# Patient Record
Sex: Female | Born: 1940 | Race: White | Hispanic: No | Marital: Married | Smoking: Former smoker
Health system: Southern US, Community
[De-identification: ages and names within clinical notes are randomized; demographics above are authoritative.]

## PROBLEM LIST (undated history)

## (undated) DIAGNOSIS — F0390 Unspecified dementia without behavioral disturbance: Secondary | ICD-10-CM

## (undated) DIAGNOSIS — E785 Hyperlipidemia, unspecified: Secondary | ICD-10-CM

## (undated) DIAGNOSIS — I10 Essential (primary) hypertension: Secondary | ICD-10-CM

## (undated) HISTORY — PX: CHOLECYSTECTOMY: SHX55

## (undated) HISTORY — PX: ABDOMINAL HYSTERECTOMY: SHX81

---

## 1999-04-12 ENCOUNTER — Ambulatory Visit (HOSPITAL_COMMUNITY): Admission: RE | Admit: 1999-04-12 | Discharge: 1999-04-12 | Payer: Self-pay

## 1999-06-14 ENCOUNTER — Other Ambulatory Visit: Admission: RE | Admit: 1999-06-14 | Discharge: 1999-06-14 | Payer: Self-pay | Admitting: Family Medicine

## 1999-12-08 ENCOUNTER — Ambulatory Visit (HOSPITAL_COMMUNITY): Admission: RE | Admit: 1999-12-08 | Discharge: 1999-12-08 | Payer: Self-pay | Admitting: Internal Medicine

## 1999-12-15 ENCOUNTER — Encounter: Payer: Self-pay | Admitting: Internal Medicine

## 1999-12-15 ENCOUNTER — Ambulatory Visit (HOSPITAL_COMMUNITY): Admission: RE | Admit: 1999-12-15 | Discharge: 1999-12-15 | Payer: Self-pay | Admitting: Internal Medicine

## 2000-04-04 ENCOUNTER — Emergency Department (HOSPITAL_COMMUNITY): Admission: EM | Admit: 2000-04-04 | Discharge: 2000-04-04 | Payer: Self-pay | Admitting: Emergency Medicine

## 2001-11-08 ENCOUNTER — Other Ambulatory Visit: Admission: RE | Admit: 2001-11-08 | Discharge: 2001-11-08 | Payer: Self-pay | Admitting: Family Medicine

## 2003-05-01 ENCOUNTER — Other Ambulatory Visit: Admission: RE | Admit: 2003-05-01 | Discharge: 2003-05-01 | Payer: Self-pay | Admitting: Family Medicine

## 2004-05-10 ENCOUNTER — Other Ambulatory Visit: Admission: RE | Admit: 2004-05-10 | Discharge: 2004-05-10 | Payer: Self-pay | Admitting: Family Medicine

## 2004-05-21 ENCOUNTER — Other Ambulatory Visit: Admission: RE | Admit: 2004-05-21 | Discharge: 2004-05-21 | Payer: Self-pay | Admitting: Diagnostic Radiology

## 2006-03-06 ENCOUNTER — Encounter: Admission: RE | Admit: 2006-03-06 | Discharge: 2006-03-06 | Payer: Self-pay | Admitting: Family Medicine

## 2007-01-02 ENCOUNTER — Ambulatory Visit: Payer: Self-pay | Admitting: Cardiology

## 2007-01-19 ENCOUNTER — Encounter: Payer: Self-pay | Admitting: Cardiology

## 2007-01-19 ENCOUNTER — Ambulatory Visit: Payer: Self-pay

## 2007-01-19 ENCOUNTER — Ambulatory Visit: Payer: Self-pay | Admitting: Cardiology

## 2007-01-19 LAB — CONVERTED CEMR LAB
ALT: 14 units/L (ref 0–40)
AST: 21 units/L (ref 0–37)
Albumin: 3.8 g/dL (ref 3.5–5.2)
Alkaline Phosphatase: 84 units/L (ref 39–117)
Bilirubin, Direct: 0.1 mg/dL (ref 0.0–0.3)
Cholesterol: 285 mg/dL (ref 0–200)
Direct LDL: 195.1 mg/dL
HDL: 61.6 mg/dL (ref >39.0)
Total Bilirubin: 0.8 mg/dL (ref 0.3–1.2)
Total CHOL/HDL Ratio: 4.6
Total Protein: 7 g/dL (ref 6.0–8.3)
Triglycerides: 116 mg/dL (ref 0–149)
VLDL: 23 mg/dL (ref 0–40)

## 2007-01-29 ENCOUNTER — Ambulatory Visit: Payer: Self-pay | Admitting: Cardiology

## 2007-02-21 ENCOUNTER — Ambulatory Visit (HOSPITAL_COMMUNITY): Admission: RE | Admit: 2007-02-21 | Discharge: 2007-02-22 | Payer: Self-pay | Admitting: Specialist

## 2007-03-12 ENCOUNTER — Encounter: Admission: RE | Admit: 2007-03-12 | Discharge: 2007-05-31 | Payer: Self-pay | Admitting: Specialist

## 2007-08-29 ENCOUNTER — Ambulatory Visit: Payer: Self-pay | Admitting: Internal Medicine

## 2007-09-21 ENCOUNTER — Encounter: Payer: Self-pay | Admitting: Internal Medicine

## 2007-09-21 ENCOUNTER — Ambulatory Visit: Payer: Self-pay | Admitting: Internal Medicine

## 2007-12-02 IMAGING — CR DG CHEST 2V
2 series · 2 of 2 positions shown · non-contrast
Comparison: None

Exam: Chest, 2 views.

HISTORY: Rotator cuff repair

[w chest pa]
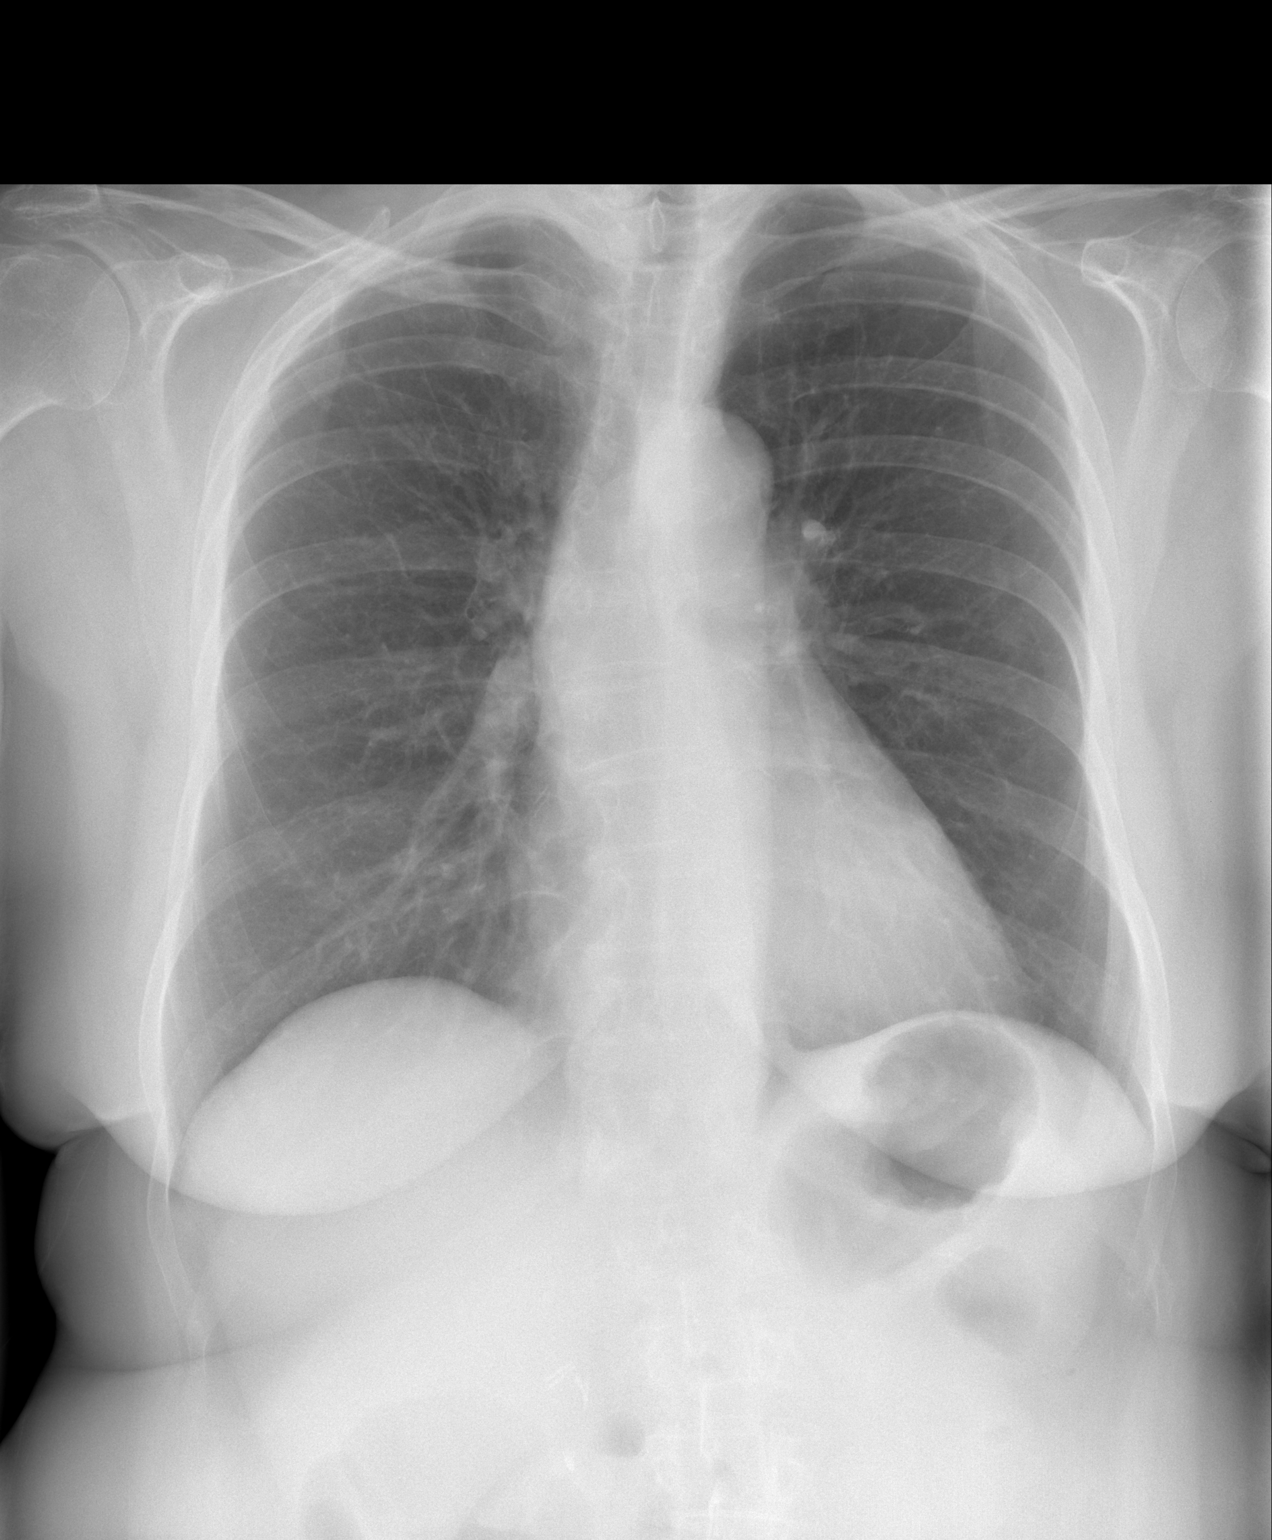

[w chest lat]
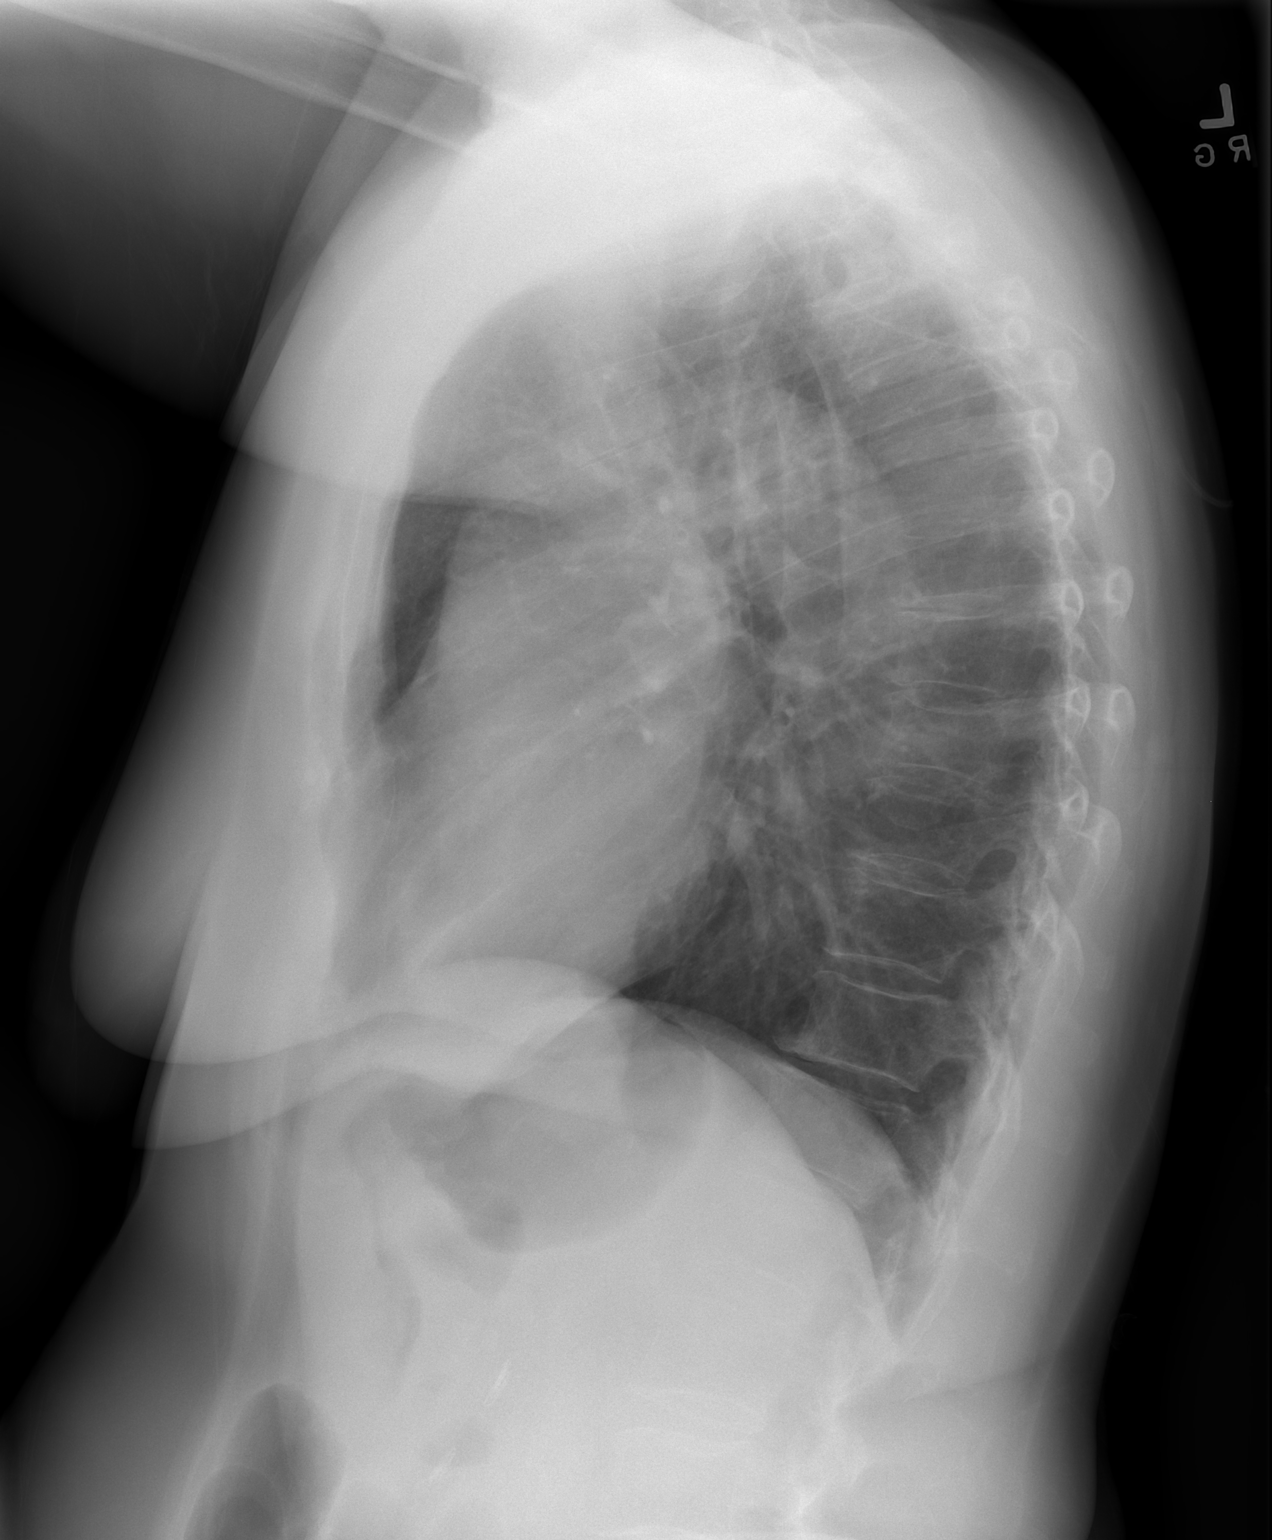

[2 of 2 positions shown; findings below may reference images not displayed]

FINDINGS: Heart size is mildly enlarged. 

There is no pleural effusions or pulmonary edema.

No air space opacities are identified.
IMPRESSION: 1. No active disease.

## 2011-01-04 NOTE — Assessment & Plan Note (Signed)
[facility]                            CARDIOLOGY OFFICE NOTE   BANEZA, BARTOSZEK                       MRN:          [id number]  DATE:01/02/2007                            DOB:          17-Sep-1940    REFERRING PHYSICIAN:  Ernestina Penna, M.D.   REASON FOR CONSULTATION:  Abnormal electrocardiogram.   HISTORY OF PRESENT ILLNESS:  Tonya Caldwell is a pleasant 70 year old woman  previously seen in our office by Jesse Sans. Wall, MD, FACC back in 1998.  She was referred at that time for a diagnostic cardiac catheterization  performed by Bruce R. Juanda Chance, MD, [facility] which revealed mild  nonobstructive atherosclerosis including a 30% left anterior descending  stenosis.  Left ventricular ejection fraction was 60% and there was  questionable slight hypokinesis of the apex.  She reports not having any  follow-up ischemic testing since that time.   She reportedly was helping her husband mow the yard and was turning a  steering wheel vigorously with her left arm several times.  This took  place approximately 10 days ago.  Subsequent to this she complained of  having severe pain in her left arm and shoulder, significantly worse  with movement, in fact with limited motion of the shoulder joint.  She  was reportedly seen by an orthopedist and also had an injection in the  shoulder and some oral pain medications.  Subsequent to this, her  symptoms have resolved.  With her shoulder discomfort she had some pain  in her back as well as discomfort in her left upper chest.   She had an electrocardiogram obtained as part of her evaluation, the  copy of which I have now, and this shows sinus rhythm with left anterior  fascicular block and right bundle branch block pattern with decreased  anterior R wave progression.  Nonspecific ST changes are also noted.  We  have an old tracing from 63 which shows a similar left anterior  fascicular block pattern, but the right bundle  branch block pattern is  new.  Typically she denies having any clear exertional chest pain.  She  has intermittent discomfort in her chest and reports significant  problems with reflux.  She also has occasional nausea and has had some  back pain.  She is not entirely clear about precipitants for these  symptoms, but does not indicate that they are exertional with activities  such as walking up a flight of stairs.   ALLERGIES:  She reports some type of drug allergy, but cannot recall the  medication.   CURRENT MEDICATIONS:  1. Synthroid 88 mcg p.o. daily.  2. Benicar 40 mg p.o. daily.  3. Multivitamin one p.o. daily.  4. Calcium supplements.   PAST MEDICAL HISTORY:  As outlined above.  She has a history of  hypertension, thyroid disease, previous partial hysterectomy,  cholecystectomy, previous documented mild coronary atherosclerosis.   SOCIAL HISTORY:  The patient is married.  She has six children.  She is  a housewife.  She has a prior history of tobacco use, but quit 20 years.  Denies  any recreational drug use or alcohol use.  She does try to walk  some for activity.   FAMILY HISTORY:  Significant for cardiovascular disease.  She has four  brothers all with history of heart attack.  Her mother died at age 14  with Alzheimer's disease and her father died at age 59 with  tuberculosis.   REVIEW OF SYSTEMS:  As described in history of present illness.  She has  constipation at times.  She feels fatigued and washed out.  Reports  problems with hiatal hernia and significant reflux disease.   PHYSICAL EXAMINATION:  VITAL SIGNS:  Blood pressure is 110/80, heart  rate 62, weight is 176 pounds.  The patient is comfortable and in no  acute distress.  HEENT:  Conjunctivae normal, oropharynx clear.  NECK:  Supple, no elevated jugular venous pressure, no bruits, no  thyromegaly.  No thyroid tenderness is obvious.  LUNGS:  Clear without labored breathing.  HEART:  Regular rate and  rhythm.  No loud murmur or S3 gallop.  No  pericardial rub.  ABDOMEN:  Soft, nontender, and no bruits.  Bowel sounds are present.  EXTREMITIES:  Exhibit no pitting edema.  SKIN:  Warm and dry.  MUSCULOSKELETAL:  No kyphosis noted.  NEUROPSYCHIATRIC:  The patient is alert and oriented x3.  Affect is  normal.   IMPRESSION:  1. Abnormal resting electrocardiogram, relatively nonspecific, with      previously documented history of mild coronary atherosclerosis,      hypertension, and family history of cardiovascular disease.  Recent      symptoms seem more musculoskeletal, particularly the left shoulder      discomfort.  She has had intermittent chest pain, although, not      specifically exertional.  This is complicated by a history of      reflux and hiatal hernia.  She has not had follow-up ischemic      evaluation and we discussed proceeding with a two-dimensional      echocardiogram as well as Adenosine Myoview for further assessment.      I will then have her come back to the office to review the      situation further.  2. Further plans to follow.     Jonelle Sidle, MD  Electronically Signed    SGM/MedQ  DD: 01/02/2007  DT: 01/02/2007  Job #: [id number]   cc:   Ernestina Penna, M.D.

## 2011-01-04 NOTE — Assessment & Plan Note (Signed)
[facility]                            CARDIOLOGY OFFICE NOTE   Tonya Caldwell, Tonya Caldwell                       MRN:          [id number]  DATE:01/19/2007                            DOB:          [birth year]    Tonya Caldwell came to the stress lab today for an adenosine Myoview. She  saw Dr. Simona Huh on Jan 02, 2007.   She has a history of nonobstructive coronary artery disease by cath  showing a 30% LAD stenosis in 1998. She actually saw me at the time. She  had normal left ventricular function.   She recently was out mowing with a steering wheel type lawnmower. After  wrenching it around some bushes for several times, she developed a  soreness in her left shoulder and neck.   She was set up for a stress Myoview with her history of coronary artery  disease and her abnormal EKG.   Today while she was in the lab, she developed a band-like tightness  under both ribs in her mid lower sternum. It lasted for about 2-3  minutes. The note says it went to her jaw and she became short of  breath. It is now resolved.   Her preliminary scan shows an EF of 70% with no significant ischemia at  least by what I am seeing. There may be some apical thinning or  attenuation.   She now feels fine.   Her medications are outlined in the chart. She is on Benicar 40. I find  it peculiar she is not on a statin. She said I told her to start one  years ago. Her cholesterol usually runs 250 plus.   PHYSICAL EXAMINATION:  VITAL SIGNS:  Her blood pressure was 157/76, her  pulse was 59 and regular.  GENERAL:  She is in no acute distress.  SKIN:  Warm and dry.  HEENT:  Normocephalic, atraumatic. PERRLA. Extraocular movements intact.  Sclera clear. Facial symmetry is normal. Dentition satisfactory.  NECK:  Supple. Carotid upstrokes equal bilaterally without bruits. There  is no JVD. Thyroid is not enlarged, trachea is midline.  LUNGS:  Clear.  HEART:  Reveals a nondisplaced  PMI. She has normal S1 and S2 without  murmurs, rubs or gallops.  ABDOMEN:  Soft with good bowel sounds.  EXTREMITIES:  Reveal no cyanosis, clubbing or edema. Pulses are intact.  NEUROLOGIC:  Intact.  SKIN:  Intact.   ASSESSMENT:  I suspect that the discomfort that Tonya Caldwell had was  secondary to the adenosine. I have reassured her and her husband. We  have had a long chat about risk factor modification. Since she is  fasting, we will draw lipids and LFTs. I feel strongly she will need a  statin. Will get back with her results. Will also followup with her  final reading of her stress nuclear study.     Thomas C. Daleen Squibb, MD, [facility]  Electronically Signed    TCW/MedQ  DD: 01/19/2007  DT: 01/19/2007  Job #: [id number]   cc:   Jonelle Sidle, MD  [facility]

## 2011-01-04 NOTE — Assessment & Plan Note (Signed)
Tonya Caldwell                         GASTROENTEROLOGY OFFICE NOTE   Tonya Caldwell, Tonya Caldwell                       MRN:          [id number]  DATE:08/29/2007                            DOB:          [birth year]    Patient is self-referred.   REASON FOR EVALUATION:  Diarrhea and requesting colonoscopy.   HISTORY:  This is a 70 year old white female with a history of  hypertension, hyperlipidemia and hypothyroidism.  She refers herself for  evaluation of diarrhea.  She is accompanied by her husband.  A followup  colonoscopy requested.  Patient was seen in this office on one occasion  in April of 2001, at that time to evaluate constipation and abdominal  pain.  She also had complaints of reflux with intermittent dysphagia.  Colonoscopy and upper endoscopy were performed December 08, 1999.  Colonoscopy including intubation of the terminal ileum was normal.  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  She was subsequently set up for  esophageal dilation which was performed December 15, 1999.  She was dilated  to a maximal diameter of 18 mm.  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  She was placed on Nexium 40 mg daily.  She was asked to  follow up in our office in 3 months but failed to do so.  She has not  been seen since.  Her and her husband tell me that she was on Nexium for  a couple of years.  She seemed to do well though at some point developed  loose stools which was attributed to Nexium.  She is currently on no  medications for reflux.  Despite this, she denies heartburn or recurrent  dysphagia.  She now reports having loose stools once per day,  approximately 3 or 4 times per week.  There is no associated urgency,  cramping or bleeding.  This has gone on for the past 2-3 months.  There  is no nocturnal component or weight loss.  Appetite is good.  No change  in diet.  Her only new  medication is Zocor.   PAST MEDICAL HISTORY:  As above.   PAST SURGICAL HISTORY:  1. Cholecystectomy.  2. Hysterectomy.  3. Appendectomy.   ALLERGIES:  BACTRIM.   CURRENT MEDICATIONS:  1. Synthroid 0.88 mg daily.  2. Benicar 40 mg daily.  3. Multivitamin.  4. Calcium.  5. Aspirin 81 mg.  6. Zocor 10 mg daily.   FAMILY HISTORY:  Son with a history of colon polyps.  Father and brother  with heart disease.   SOCIAL HISTORY:  Patient is married with 5 children.  She is accompanied  by her husband.  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   REVIEW OF SYSTEMS:  Per diagnostic evaluation form.   PHYSICAL EXAMINATION:  Well-appearing female, no acute distress.  Blood  pressure 130/80, heart rate 76, weight is 178.6 pounds, she is 5 feet 2  inches in height.  HEENT:  Sclerae are anicteric, conjunctivae are pink, oral mucosa is  intact.  There  is no adenopathy.  LUNGS:  Clear.  HEART:  Regular.  ABDOMEN:  Soft without tenderness, mass or hernia.  Good bowel sounds  heard.  EXTREMITIES:  Without edema.   IMPRESSION:  1. Intermittent loose stools, nonspecific without alarm features.  I      doubt this is pathologic.  2. Family history of colon polyps.  Last colonoscopy 8 years ago.      Reasonable to pursue repeat evaluation for the purposes of repeat      screening as well as evaluate change in bowel habits.  3. Reflux disease with a history of esophageal stricture and a web.      Currently asymptomatic, off of proton pump inhibitor post dilation.   RECOMMENDATIONS:  1. Colonoscopy with polypectomy if necessary.  The nature of the      procedure as well as the risks, benefits and alternatives have been      reviewed.  She understood and agreed to proceed.  2. Align one daily for 2 weeks to see if this improves bowel habits.      Samples have been provided.  3. Warned regarding recurrence of reflux symptoms and/or dysphagia off      proton pump inhibitors.  She  may need to resume over the counter      Prilosec OTC for recurrent symptoms.  4. Ongoing general medical care with [facility].     Wilhemina Bonito. Marina Goodell, MD  Electronically Signed    JNP/MedQ  DD: 08/29/2007  DT: 08/29/2007  Job #: [id number]   cc:   Paulita Cradle, NP

## 2011-01-04 NOTE — Assessment & Plan Note (Signed)
[facility]                            CARDIOLOGY OFFICE NOTE   SAACHI, ZALE                       MRN:          [id number]  DATE:01/29/2007                            DOB:          1941-07-28    PRIMARY CARE PHYSICIAN:  Dr. Rudi Heap.   REASON FOR VISIT:  Followup cardiac testing.   HISTORY OF PRESENT ILLNESS:  I saw Ms. Steeves back in May.  Her history  is detailed in my previous note.  I referred her for an echocardiogram  as well as an adenosine Myoview.  She actually saw Dr. Daleen Squibb back on the  thirtieth of May given some chest pain that she experienced during her  stress test.  This study was quite reassuring, revealing an ejection  fraction of 70% with normal wall motion and no evidence of scar or  ischemia.  She has had complete resolution of her previous symptoms.  Her echocardiogram report is noted, describing an area of bright  echodensity near the anterolateral apex with question of fibrosis of the  endocardium, cannot exclude mass.  The ejection fraction was calculated  at 60% - 70%.  I reviewed these images and there is a small echo-bright  area in this region that is non-mobile.  It is possible that there is an  area of calcification of either the endomyocardium or chordal  apparatus/trabeculation.  She has had no TIA or stroke symptoms.  No  large wall motion abnormalities noted to explain this from the  perspective of a thrombus.  I reviewed these results with the patient  and her husband today.  Followup blood work obtained by Dr. Daleen Squibb at the  last visit demonstrated a total cholesterol of 285, LDL 195, HDL 61, and  triglycerides 116.  I also reviewed these as well.   PRESENT MEDICATIONS:  1. Synthroid 88 mcg p.o. daily.  2. Benicar 40 mg p.o. daily.  3. Multivitamin daily.  4. Calcium daily.  5  Aspirin 81 mg Monday, Wednesday, Friday, Saturday.   REVIEW OF SYSTEMS:  As described in the history of present  illness.   EXAMINATION:  Blood pressure is 158/90, heart rate is 60, weight is 180  pounds.  The patient is comfortable and in no acute distress.  HEENT:  Conjunctivae and lids normal, oropharynx is clear.  NECK:  Supple, no elevated jugular venous pressure or loud bruits, no  thyromegaly is noted.  LUNGS:  Clear without labored breathing.  CARDIAC:  Reveals a regular rate and rhythm, loud murmur or S3 gallop.  ABDOMEN:  Soft, nontender and no bruits.  EXTREMITIES:  Show no pitting edema.  SKIN:  Warm and dry.  MUSCULOSKELETAL:  No kyphosis is noted.  NEUROPSYCHIATRIC:  Patient alert and oriented x3, affect is normal.   IMPRESSION AND RECOMMENDATIONS:  1. Significant hyperlipidemia with a direct LDL cholesterol of 195.  I      discussed statin therapy with the patient and her husband today.      She states that she was prescribed Lipitor in the past and had to  discontinue it after only a few days due to significant muscle      weakness.  She would like to review therapy with her primary care      physician although in general statin therapy would be indicated in      her case for aggressive risk factor modification.  I would      certainly avoid Lipitor and consider other preparations such as      either Zocor or perhaps Crestor as a next option.  Ideally given      her prior documented history of mild coronary atherosclerosis, I      would aim for an LDL cholesterol around 70.  She certainly has a      long way to go to achieve this goal.  2. Reassuring Myoview indicating no evidence of scar or ischemia.  I      suspect that the patient's chest pain was musculoskeletal as      discussed previously.  Abnormal echocardiogram with bright      echodensity in the anterolateral apex.  This may well be an area of      fibrosis or calcification as discussed.  Could not entirely exclude      thrombus or mass.  I reviewed this with the patient and her husband      today.  We talked  about proceeding with additional studies such as      a cardiac MRI to better define this, although at this point she was      most comfortable with simple observation alone.  We have      compromised and I will have her follow up with me over the next 6      months with a repeat echocardiogram to ensure stability.  We can      proceed on from there.     Jonelle Sidle, MD  Electronically Signed    SGM/MedQ  DD: 01/29/2007  DT: 01/29/2007  Job #: [id number]   cc:   Ernestina Penna, M.D.

## 2011-01-04 NOTE — Op Note (Signed)
NAMEWOODROW, DULSKI                ACCOUNT NO.:  [id number]   MEDICAL RECORD NO.:  [id number]          PATIENT TYPE:  AMB   LOCATION:  DAY                          FACILITY:  [facility]   PHYSICIAN:  Jene Every, M.D.    DATE OF BIRTH:  25-Jul-1941   DATE OF PROCEDURE:  02/21/2007  DATE OF DISCHARGE:                               OPERATIVE REPORT   PREOPERATIVE DIAGNOSES:  1. Rotator cuff tear.  2. Adhesive capsulitis.  3. Impingement syndrome, left shoulder.   POSTOPERATIVE DIAGNOSES:  1. Rotator cuff tear.  2. Adhesive capsulitis.  3. Impingement syndrome, left shoulder.   PROCEDURE:  1. MUA, EUA.  2. Open left rotator cuff repair.  3. Subacromial decompression.  4. Lysis of adhesions.   ANESTHESIA:  General.   ASSISTANT:  Strader.   BRIEF HISTORY:  A 70 year old with refractory shoulder pain, impingement  syndrome,  full-thickness tear and an MRI refractory to conservative  treatment, limited range of motion indicated for repair and subacromial  decompression.  The risks and benefits were discussed including  bleeding, infection, injury to vascular structures, suboptimal range of  motion, recurrent tear, need for revision, etc.   The patient preoperatively was noted to have dysuria, no fevers or  chills and a presumed UTI on UA. She was given Cipro 400 mg IV.  No  evidence of systemic infection, therefore proceeded with the scheduled  case.   TECHNIQUE:  The patient in the supine beach-chair position after  induction of adequate anesthesia and 400 mg IV Cipro, the left shoulder  and upper extremity was prepped and draped in the usual sterile fashion.  An  incision was made over the anterior aspect of the acromion.  The  subcutaneous tissue was dissected, electrocautery was utilized to  achieve hemostasis.  The raphe between the anterior and lateral heads of  the deltoid was identified, divided approximately 3 cm from the  anterolateral aspect of the acromion and  subperiosteally elevated. From  the anterolateral aspect of the acromion, the CA ligament was excised.  The anterior acromial spur was removed with an oscillating saw, further  contoured with a 3-mm Kerrison.  Hypertrophic bursa was excised, full-  thickness tear was noted longitudinally in the supraspinatus with no  detachment from its origin. It was debrided,  excised to good bleeding  tissue and repaired side-to-side with #1 Vicryl figure-of-eight suture.  I lysed prior to that adhesion the  subacromial with the digital lysis.  Prior to the incision, we ranged the shoulder with no significant  restriction noted.   No impingement underneath the [facility] joint was noted.  The wound was  copiously irrigated.  Inspection revealed excellent repair and full  coverage, no tension on the repair site.  I then repaired the raphe with  #1 Vicryl interrupted figure-of-eight sutures.  The subcutaneous tissue  was reapproximated with 2-0 Vicryl simple sutures. The skin was  reapproximated with 4-0 subcuticular Prolene.  The wound was reinforced  with Steri-Strips, sterile dressing applied and placed supine on the  hospital bed. After a sling was applied, extubated without difficulty  and  transported to the recovery room in satisfactory condition. The  patient tolerated the procedure well with no complications.      Jene Every, M.D.  Electronically Signed     JB/MEDQ  D:  02/21/2007  T:  02/21/2007  Job:  [id number]

## 2011-06-07 LAB — URINE CULTURE
Colony Count: NO GROWTH
Culture: NO GROWTH
Special Requests: POSITIVE

## 2011-06-08 LAB — HEMOGLOBIN AND HEMATOCRIT, BLOOD: HCT: 38

## 2011-06-08 LAB — BASIC METABOLIC PANEL
CO2: 29
Calcium: 9.8
Chloride: 107
Creatinine, Ser: 1.04
GFR calc Af Amer: >60
Glucose, Bld: 99
Sodium: 142

## 2011-06-08 LAB — APTT: aPTT: 33

## 2011-06-08 LAB — PROTIME-INR: Prothrombin Time: 13.1

## 2012-05-03 ENCOUNTER — Encounter (HOSPITAL_BASED_OUTPATIENT_CLINIC_OR_DEPARTMENT_OTHER): Payer: Medicare Other | Attending: Internal Medicine

## 2012-05-03 DIAGNOSIS — E039 Hypothyroidism, unspecified: Secondary | ICD-10-CM | POA: Insufficient documentation

## 2012-05-03 DIAGNOSIS — S91009A Unspecified open wound, unspecified ankle, initial encounter: Secondary | ICD-10-CM | POA: Insufficient documentation

## 2012-05-03 DIAGNOSIS — I10 Essential (primary) hypertension: Secondary | ICD-10-CM | POA: Insufficient documentation

## 2012-05-03 DIAGNOSIS — L03119 Cellulitis of unspecified part of limb: Secondary | ICD-10-CM | POA: Insufficient documentation

## 2012-05-03 DIAGNOSIS — K219 Gastro-esophageal reflux disease without esophagitis: Secondary | ICD-10-CM | POA: Insufficient documentation

## 2012-05-03 DIAGNOSIS — F0390 Unspecified dementia without behavioral disturbance: Secondary | ICD-10-CM | POA: Insufficient documentation

## 2012-05-03 DIAGNOSIS — Z79899 Other long term (current) drug therapy: Secondary | ICD-10-CM | POA: Insufficient documentation

## 2012-05-03 DIAGNOSIS — Y849 Medical procedure, unspecified as the cause of abnormal reaction of the patient, or of later complication, without mention of misadventure at the time of the procedure: Secondary | ICD-10-CM | POA: Insufficient documentation

## 2012-05-03 DIAGNOSIS — L02419 Cutaneous abscess of limb, unspecified: Secondary | ICD-10-CM | POA: Insufficient documentation

## 2012-05-03 DIAGNOSIS — S81009A Unspecified open wound, unspecified knee, initial encounter: Secondary | ICD-10-CM | POA: Insufficient documentation

## 2012-05-03 NOTE — Progress Notes (Signed)
[facility]  NAME:  Tonya Caldwell, PHELAN NO.:  [id number]  MEDICAL RECORD NO.:  [id number]      DATE OF BIRTH:  1941/06/09  PHYSICIAN:  Maxwell Caul, M.D. VISIT DATE:  05/03/2012                                  OFFICE VISIT   LOCATION:  [facility].  [name] is a 71 year old woman who arrives accompanied by her husband who provides all the information.  She currently had an area on her left lower leg that was concerning and she was referred to a dermatologist in [place].  She had this area biopsied by a punch biopsy roughly 8 months ago.  However the area never really healed and eventually she ended up with a chronic wound about the size of a dime.  This has become more and more painful.  The patient has dementia, has a lot of difficulty keeping her hands away from this area.  I do not believe that it is being cultured as far as I am aware.  PAST MEDICAL HISTORY:  Hypothyroidism, gastroesophageal reflux disease, hypertension, dementia.  She has a history of cholecystectomy and hysterectomy.  MEDICATION LIST:  Reviewed.  She is on Synthroid, not sure of the milligram amount, omeprazole 20 daily, Lasix 40 daily, ASA 160 daily, Exelon patch 9.6 daily and she is also on another blood pressure medication.  PHYSICAL EXAMINATION:  VITAL SIGNS:  Temperature is 98.5, pulse 69, respirations 14, blood pressure is 138/78.  RESPIRATORY:  Clear air entry bilaterally.  CARDIAC:  Heart sounds are normal.  She does not appear to be dehydrated. EXTREMITIES:  Peripheral pulses are palpable.  WOUND EXAM:  On the medial left lower leg is the area measuring 0.9 x 1.3 x 0.1.  The area had a fibrous base to it which I attempted debridement, however she tolerated this poorly.  This did not seem to have probe more deeply.  The area was cultured.  More worrisome than surrounding the wound is an area of warmth and erythema which suggest  a fairly large area of surrounding cellulitis.  There was no crepitus here however the entire erythematous area was painful.  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  I have cultured this.  I think she has surrounding cellulitis.  I have started her on Septra.  I avoided doxycycline due to a significant gastroesophageal reflux history.  A culture of this wound was done.  The Septra is empiric.  To the actual wound we used Santyl with a foam covering under a Kerlix, Coban wrap.  I have provided her analgesics with hydrocodone APAP 5/325 one p.o. q.6 p.r.n.  we will see her again in a week.          ______________________________ Maxwell Caul, M.D.     MGR/MEDQ  D:  05/03/2012  T:  05/03/2012  Job:  [id number]

## 2012-05-31 ENCOUNTER — Encounter (HOSPITAL_BASED_OUTPATIENT_CLINIC_OR_DEPARTMENT_OTHER): Payer: Medicare Other | Attending: Internal Medicine

## 2012-06-28 ENCOUNTER — Encounter (HOSPITAL_BASED_OUTPATIENT_CLINIC_OR_DEPARTMENT_OTHER): Payer: Medicare Other | Attending: Internal Medicine

## 2012-06-28 DIAGNOSIS — L98499 Non-pressure chronic ulcer of skin of other sites with unspecified severity: Secondary | ICD-10-CM | POA: Insufficient documentation

## 2012-06-28 DIAGNOSIS — I872 Venous insufficiency (chronic) (peripheral): Secondary | ICD-10-CM | POA: Insufficient documentation

## 2012-07-26 ENCOUNTER — Encounter (HOSPITAL_BASED_OUTPATIENT_CLINIC_OR_DEPARTMENT_OTHER): Payer: Medicare Other

## 2012-07-26 ENCOUNTER — Encounter (HOSPITAL_BASED_OUTPATIENT_CLINIC_OR_DEPARTMENT_OTHER): Payer: Medicare Other | Attending: Internal Medicine

## 2012-07-26 DIAGNOSIS — Y849 Medical procedure, unspecified as the cause of abnormal reaction of the patient, or of later complication, without mention of misadventure at the time of the procedure: Secondary | ICD-10-CM | POA: Insufficient documentation

## 2012-07-26 DIAGNOSIS — S81809A Unspecified open wound, unspecified lower leg, initial encounter: Secondary | ICD-10-CM | POA: Insufficient documentation

## 2012-08-30 ENCOUNTER — Encounter (HOSPITAL_BASED_OUTPATIENT_CLINIC_OR_DEPARTMENT_OTHER): Payer: Medicare Other | Attending: Internal Medicine

## 2012-08-30 DIAGNOSIS — Y849 Medical procedure, unspecified as the cause of abnormal reaction of the patient, or of later complication, without mention of misadventure at the time of the procedure: Secondary | ICD-10-CM | POA: Insufficient documentation

## 2012-08-30 DIAGNOSIS — T8189XA Other complications of procedures, not elsewhere classified, initial encounter: Secondary | ICD-10-CM | POA: Insufficient documentation

## 2012-09-18 ENCOUNTER — Encounter: Payer: Self-pay | Admitting: Internal Medicine

## 2012-09-27 ENCOUNTER — Encounter (HOSPITAL_BASED_OUTPATIENT_CLINIC_OR_DEPARTMENT_OTHER): Payer: Medicare Other | Attending: Internal Medicine

## 2012-09-27 DIAGNOSIS — Y849 Medical procedure, unspecified as the cause of abnormal reaction of the patient, or of later complication, without mention of misadventure at the time of the procedure: Secondary | ICD-10-CM | POA: Insufficient documentation

## 2012-09-27 DIAGNOSIS — T8189XA Other complications of procedures, not elsewhere classified, initial encounter: Secondary | ICD-10-CM | POA: Insufficient documentation

## 2012-10-04 ENCOUNTER — Encounter (HOSPITAL_BASED_OUTPATIENT_CLINIC_OR_DEPARTMENT_OTHER): Payer: Medicare Other

## 2012-11-23 ENCOUNTER — Ambulatory Visit: Payer: Self-pay | Admitting: Family Medicine

## 2013-01-11 ENCOUNTER — Other Ambulatory Visit: Payer: Self-pay | Admitting: Family Medicine

## 2013-02-04 ENCOUNTER — Telehealth: Payer: Self-pay | Admitting: Family Medicine

## 2013-02-04 NOTE — Telephone Encounter (Signed)
Need to evaluate and check tests. Before meds

## 2013-02-05 NOTE — Telephone Encounter (Signed)
Pt aware she will need appt

## 2013-02-15 ENCOUNTER — Telehealth: Payer: Self-pay | Admitting: Family Medicine

## 2013-02-15 NOTE — Telephone Encounter (Signed)
Husband is having a very difficult time taking care of Hildy on his own. He is requesting home health or hospice services.  Dajane is unable to come in for a visit.  Husband is emotionally and physically exhausted.   I will speak with Dr. Christell Constant tomorrow and contact Mr. Borbon.

## 2013-02-16 NOTE — Telephone Encounter (Signed)
Spoke with patient's husband again this morning.  A Hospice nurse is going to come out and evaluate her on Mon 02/18/13 to see what services she can receive. He will let us know if we need to fill out any intake forms.

## 2013-02-18 ENCOUNTER — Telehealth: Payer: Self-pay | Admitting: Family Medicine

## 2013-02-18 ENCOUNTER — Telehealth: Payer: Self-pay

## 2013-02-18 NOTE — Telephone Encounter (Signed)
Letter faxed to Jaci Carrel as requested with weights .

## 2013-02-18 NOTE — Telephone Encounter (Signed)
Tonya Caldwell would like documentation of weight loss \\per  her chart weight readings show significant weight loss And they would like to admit her to hospice for malnutrition    Call back [phone]

## 2013-02-18 NOTE — Telephone Encounter (Signed)
This is okay for her to do and call hospice

## 2013-02-19 NOTE — Telephone Encounter (Signed)
Letter faxed to Tonya Caldwell for pt 's malnutrition and weight loss.

## 2013-02-20 ENCOUNTER — Telehealth: Payer: Self-pay | Admitting: *Deleted

## 2013-02-20 NOTE — Telephone Encounter (Signed)
Family refused psych consult. They would like a standing order for Haldol 1mg  q 4 hours prn, may repeat in 1 hour x 1. Increase to 5mg  in not effective

## 2013-02-20 NOTE — Telephone Encounter (Signed)
This is okay to do this

## 2013-02-20 NOTE — Telephone Encounter (Signed)
The standing order is okay

## 2013-02-20 NOTE — Telephone Encounter (Signed)
Hospice went out and did an assessment on her yesterday. They are going to accept her but feels she needs a Mental Health Consult and needs an order for this

## 2013-02-25 ENCOUNTER — Telehealth: Payer: Self-pay | Admitting: *Deleted

## 2013-02-25 MED ORDER — CHLORPROMAZINE HCL 25 MG PO TABS: 25.0000 mg | ORAL_TABLET | Freq: Three times a day (TID) | ORAL | Status: DC

## 2013-02-25 NOTE — Telephone Encounter (Signed)
Hospice nurse called and said that Dr Christell Constant ok'd rx for haldol last week and that pt has received twice. It completely knocks her out and husband does not want her to have anymore. She is still combative with husband if she doesn't have any meds. Per hospice the next step would be to try Thorazine. Will you ok and if so please send to [facility]. Please let hospice nurse know at [phone]

## 2013-02-25 NOTE — Telephone Encounter (Signed)
Patient aware and rx sent to [facility]

## 2013-02-25 NOTE — Telephone Encounter (Signed)
Changed halodol to throazine rx sent to pharmacy

## 2013-02-26 ENCOUNTER — Telehealth: Payer: Self-pay | Admitting: Nurse Practitioner

## 2013-02-26 ENCOUNTER — Telehealth: Payer: Self-pay | Admitting: Family Medicine

## 2013-02-26 NOTE — Telephone Encounter (Signed)
Ok will see

## 2013-02-26 NOTE — Telephone Encounter (Signed)
WANTS TO BRING PT IN TO DISCUSS MEDS. HOSPICE TOLD HER TO CALL us. PT VERY SLEEPY, DOESN'T GET UP. NOT EATING. WANTS APPT.

## 2013-02-27 NOTE — Telephone Encounter (Signed)
appt given for Friday at 9:00 with [place] Charon.

## 2013-03-01 ENCOUNTER — Ambulatory Visit: Payer: Self-pay | Admitting: Family Medicine

## 2013-03-04 ENCOUNTER — Telehealth: Payer: Self-pay | Admitting: *Deleted

## 2013-03-04 NOTE — Telephone Encounter (Signed)
This is very complicated! Tonya Caldwell, who is currently on Hospice, needs a visit because hospice is pulling out because they do not think she is appropriate for them! Family wants to try Namenda, but hospice feels like it is more psychiatric? Anyway they can not get here in here for appt, she had one last week and family couldn/t get her out of house. They want to know if we can do a home visit to assess? Please call Hospice at [phone] and family at [phone]

## 2013-03-04 NOTE — Telephone Encounter (Signed)
Spoke with husband and he refused to take her behavioral health. He is requesting call from hospice. Hospice notified and message left on [facility] to call pt.

## 2013-03-04 NOTE — Telephone Encounter (Signed)
Will need to take her to Behavioral health for assessment if they think it is psychiatric. The psyche team will need to assess. She will need OV. Not doing any more home  Visits.

## 2013-03-11 ENCOUNTER — Telehealth: Payer: Self-pay | Admitting: Family Medicine

## 2013-03-11 NOTE — Telephone Encounter (Signed)
Do not think that Namenda is appropriate in this situation You have to take Namenda for several weeks to see any benefit and he is used for Alzheimer's patient

## 2013-03-11 NOTE — Telephone Encounter (Signed)
Lorazepam called to [facility],  Dwm - son DON wonders how you feel about namenda for her- just let him know

## 2013-03-11 NOTE — Telephone Encounter (Signed)
Hospice coming into home

## 2013-03-11 NOTE — Telephone Encounter (Signed)
Dr Christell Constant, [name] could not tolerate the haldol or the thorazine- she was very lethargic and slept constantly- these were stopped and her med list is up to date.  Husband and son are concerned about her behaviors- she gets very aggitated when people are there and every morning when he has to "clean her" and change her.  Is there any meds they can give her just prn to take the "egde off"- because other than these occasions - she is doing ok (walking , trys to talk, and smiles)  [facility] use [facility] [place] Please call Roe Coombs at [phone]

## 2013-03-11 NOTE — Telephone Encounter (Signed)
Try lorazepam 0.5 one twice daily if needed for agitation #30 no refill

## 2013-03-12 NOTE — Telephone Encounter (Signed)
Son aware we will not try namenda

## 2013-04-12 ENCOUNTER — Other Ambulatory Visit: Payer: Self-pay | Admitting: Family Medicine

## 2013-04-12 NOTE — Telephone Encounter (Signed)
Last seen 08/23/12  FPW

## 2013-04-12 NOTE — Telephone Encounter (Signed)
Needs to be seen by a provider

## 2013-04-15 ENCOUNTER — Encounter: Payer: Self-pay | Admitting: Internal Medicine

## 2013-04-15 ENCOUNTER — Other Ambulatory Visit: Payer: Self-pay | Admitting: Family Medicine

## 2013-04-15 NOTE — Telephone Encounter (Signed)
[facility] notified that furosemided been refused -pt needs appt

## 2013-04-16 NOTE — Telephone Encounter (Signed)
Last seen 08/23/12  FPW 

## 2013-04-17 NOTE — Telephone Encounter (Signed)
Prescription renewed in EPIC. Patient needs to be seen.

## 2013-04-17 NOTE — Telephone Encounter (Addendum)
Spoke with Roe Coombs (son) and he states she is mointored by Hospice by [facility]. Informed son that Hospice should have Physician team to mointor her medications ,weights,and labs.  Son agreed that hospice should take over her meds

## 2013-05-28 ENCOUNTER — Inpatient Hospital Stay (HOSPITAL_COMMUNITY)
Admission: EM | Admit: 2013-05-28 | Discharge: 2013-06-03 | DRG: 480 | Disposition: A | Discharge status: Skilled Nursing Facility | Payer: Medicare Other | Attending: Family Medicine | Admitting: Family Medicine

## 2013-05-28 ENCOUNTER — Emergency Department (HOSPITAL_COMMUNITY): Payer: Medicare Other

## 2013-05-28 ENCOUNTER — Encounter (HOSPITAL_COMMUNITY): Payer: Self-pay | Admitting: Emergency Medicine

## 2013-05-28 DIAGNOSIS — R634 Abnormal weight loss: Secondary | ICD-10-CM | POA: Diagnosis present

## 2013-05-28 DIAGNOSIS — E870 Hyperosmolality and hypernatremia: Secondary | ICD-10-CM | POA: Diagnosis present

## 2013-05-28 DIAGNOSIS — D649 Anemia, unspecified: Secondary | ICD-10-CM | POA: Diagnosis present

## 2013-05-28 DIAGNOSIS — F02818 Dementia in other diseases classified elsewhere, unspecified severity, with other behavioral disturbance: Secondary | ICD-10-CM | POA: Diagnosis present

## 2013-05-28 DIAGNOSIS — F0390 Unspecified dementia without behavioral disturbance: Secondary | ICD-10-CM

## 2013-05-28 DIAGNOSIS — Z66 Do not resuscitate: Secondary | ICD-10-CM | POA: Diagnosis present

## 2013-05-28 DIAGNOSIS — R630 Anorexia: Secondary | ICD-10-CM | POA: Diagnosis present

## 2013-05-28 DIAGNOSIS — S72143A Displaced intertrochanteric fracture of unspecified femur, initial encounter for closed fracture: Principal | ICD-10-CM | POA: Diagnosis present

## 2013-05-28 DIAGNOSIS — E039 Hypothyroidism, unspecified: Secondary | ICD-10-CM | POA: Diagnosis present

## 2013-05-28 DIAGNOSIS — G309 Alzheimer's disease, unspecified: Secondary | ICD-10-CM | POA: Diagnosis present

## 2013-05-28 DIAGNOSIS — E43 Unspecified severe protein-calorie malnutrition: Secondary | ICD-10-CM | POA: Insufficient documentation

## 2013-05-28 DIAGNOSIS — F0280 Dementia in other diseases classified elsewhere without behavioral disturbance: Secondary | ICD-10-CM | POA: Diagnosis present

## 2013-05-28 DIAGNOSIS — S72001A Fracture of unspecified part of neck of right femur, initial encounter for closed fracture: Secondary | ICD-10-CM

## 2013-05-28 DIAGNOSIS — F0281 Dementia in other diseases classified elsewhere with behavioral disturbance: Secondary | ICD-10-CM | POA: Diagnosis present

## 2013-05-28 DIAGNOSIS — W06XXXA Fall from bed, initial encounter: Secondary | ICD-10-CM | POA: Diagnosis present

## 2013-05-28 DIAGNOSIS — S72009A Fracture of unspecified part of neck of unspecified femur, initial encounter for closed fracture: Secondary | ICD-10-CM

## 2013-05-28 DIAGNOSIS — E785 Hyperlipidemia, unspecified: Secondary | ICD-10-CM | POA: Diagnosis present

## 2013-05-28 DIAGNOSIS — E871 Hypo-osmolality and hyponatremia: Secondary | ICD-10-CM | POA: Diagnosis present

## 2013-05-28 DIAGNOSIS — E876 Hypokalemia: Secondary | ICD-10-CM | POA: Diagnosis not present

## 2013-05-28 DIAGNOSIS — IMO0002 Reserved for concepts with insufficient information to code with codable children: Secondary | ICD-10-CM

## 2013-05-28 DIAGNOSIS — Z87891 Personal history of nicotine dependence: Secondary | ICD-10-CM

## 2013-05-28 DIAGNOSIS — I10 Essential (primary) hypertension: Secondary | ICD-10-CM | POA: Diagnosis present

## 2013-05-28 DIAGNOSIS — Z515 Encounter for palliative care: Secondary | ICD-10-CM

## 2013-05-28 DIAGNOSIS — N179 Acute kidney failure, unspecified: Secondary | ICD-10-CM | POA: Diagnosis not present

## 2013-05-28 DIAGNOSIS — Y9210 Unspecified residential institution as the place of occurrence of the external cause: Secondary | ICD-10-CM | POA: Diagnosis present

## 2013-05-28 HISTORY — DX: Hyperlipidemia, unspecified: E78.5

## 2013-05-28 HISTORY — DX: Essential (primary) hypertension: I10

## 2013-05-28 HISTORY — DX: Unspecified dementia, unspecified severity, without behavioral disturbance, psychotic disturbance, mood disturbance, and anxiety: F03.90

## 2013-05-28 LAB — BASIC METABOLIC PANEL
Calcium: 9.4 mg/dL (ref 8.4–10.5)
GFR calc non Af Amer: 43 mL/min — ABNORMAL LOW (ref >90)
Potassium: 3.5 mEq/L (ref 3.5–5.1)
Sodium: 147 mEq/L — ABNORMAL HIGH (ref 135–145)

## 2013-05-28 LAB — CBC WITH DIFFERENTIAL/PLATELET
Hemoglobin: 11.5 g/dL — ABNORMAL LOW (ref 12.0–15.0)
Lymphocytes Relative: 11 % — ABNORMAL LOW (ref 12–46)
Lymphs Abs: 1 10*3/uL (ref 0.7–4.0)
Monocytes Relative: 9 % (ref 3–12)
Neutro Abs: 7.3 10*3/uL (ref 1.7–7.7)
Neutrophils Relative %: 80 % — ABNORMAL HIGH (ref 43–77)
Platelets: 177 10*3/uL (ref 150–400)
RBC: 3.92 MIL/uL (ref 3.87–5.11)
WBC: 9.2 10*3/uL (ref 4.0–10.5)

## 2013-05-28 LAB — URINALYSIS, ROUTINE W REFLEX MICROSCOPIC
Glucose, UA: NEGATIVE mg/dL
Hgb urine dipstick: NEGATIVE
Ketones, ur: NEGATIVE mg/dL
Protein, ur: NEGATIVE mg/dL

## 2013-05-28 LAB — PROTIME-INR
INR: 1 (ref 0.00–1.49)
Prothrombin Time: 13 seconds (ref 11.6–15.2)

## 2013-05-28 LAB — URINE MICROSCOPIC-ADD ON

## 2013-05-28 MED ORDER — LORAZEPAM 2 MG/ML IJ SOLN
0.5000 mg | Freq: Once | INTRAMUSCULAR | Status: AC
  Administered 2013-05-28: 0.5 mg via INTRAVENOUS
  Filled 2013-05-28: qty 1

## 2013-05-28 MED ORDER — ONDANSETRON HCL 4 MG/2ML IJ SOLN
4.0000 mg | Freq: Once | INTRAMUSCULAR | Status: AC
  Administered 2013-05-28: 4 mg via INTRAVENOUS
  Filled 2013-05-28: qty 2

## 2013-05-28 MED ORDER — SODIUM CHLORIDE 0.9 % IV SOLN
1000.0000 mL | INTRAVENOUS | Status: DC
  Administered 2013-05-28: 1000 mL via INTRAVENOUS

## 2013-05-28 MED ORDER — MORPHINE SULFATE 2 MG/ML IJ SOLN
2.0000 mg | Freq: Once | INTRAMUSCULAR | Status: AC
  Administered 2013-05-28: 2 mg via INTRAVENOUS
  Filled 2013-05-28: qty 1

## 2013-05-28 NOTE — ED Notes (Signed)
Patient fell out of bed (about 10ft) about three hours ago. Family got her back up onto bed, but she wouldn't lay down, would not bear weight, moaned in distress to movement of right leg and seemed in pain, so called EMS. Family states severe dementia, on hospice, and is a DNR. On arrival, holding onto right hip which looks deformed. Good pulses to bilateral feet.

## 2013-05-28 NOTE — ED Notes (Signed)
Orthopedic MD at bedside. 

## 2013-05-28 NOTE — ED Provider Notes (Signed)
CSN: [id number]     Arrival date & time 05/28/13  1926 History   First MD Initiated Contact with Patient 05/28/13 1945     Chief Complaint  Patient presents with  . Hip Pain  level 5 caveat due to dementia. (Consider location/radiation/quality/duration/timing/severity/associated sxs/prior Treatment) Patient is a 72 y.o. female presenting with hip pain. The history is provided by the patient and a relative.  Hip Pain   patient reportedly fell out of bed onto a carpeted floor. She has severe dementia she is on hospice for. She initially was ambulatory but then has not been able to walk. Appears to have pain in her right hip. She is not on anticoagulation.  Past Medical History  Diagnosis Date  . Dementia   . Hypertension   . Hyperlipemia    Past Surgical History  Procedure Laterality Date  . Cholecystectomy    . Abdominal hysterectomy    . Hip closed reduction Right 05/29/2013   History reviewed. No pertinent family history. History  Substance Use Topics  . Smoking status: Former Games developer  . Smokeless tobacco: Not on file  . Alcohol Use: No   OB History   Grav Para Term Preterm Abortions TAB SAB Ect Mult Living                 Review of Systems  Unable to perform ROS: Dementia    Allergies  Review of patient's allergies indicates no known allergies.  Home Medications   Current Outpatient Rx  Name  Route  Sig  Dispense  Refill  . acetaminophen (TYLENOL) 500 MG tablet   Oral   Take 1 tablet (500 mg total) by mouth every 6 (six) hours as needed for pain.   30 tablet   0   . aspirin EC 325 MG tablet   Oral   Take 1 tablet (325 mg total) by mouth daily.   30 tablet   0    BP 125/88  Pulse 70  Temp(Src) 98.2 F (36.8 C) (Oral)  Resp 18  Ht 5\' 1"  (1.549 m)  Wt 120 lb (54.432 kg)  BMI 22.69 kg/m2  SpO2 99% Physical Exam  Constitutional: She appears well-developed and well-nourished.  HENT:  Head: Normocephalic.  Cardiovascular: Normal rate and regular  rhythm.   Pulmonary/Chest: Effort normal.  Abdominal: Soft.  Musculoskeletal: She exhibits tenderness.  Tenderness to right hip and pelvis. Decreased range of motion. No tenderness at knee or ankle. Some shortening of right lower extremity.  Neurological: She is alert.  Severe baseline dementia    ED Course  Procedures (including critical care time) Labs Review Labs Reviewed  BASIC METABOLIC PANEL - Abnormal; Notable for the following:    Sodium 147 (*)    Glucose, Bld 160 (*)    Creatinine, Ser 1.21 (*)    GFR calc non Af Amer 43 (*)    GFR calc Af Amer 50 (*)    All other components within normal limits  CBC WITH DIFFERENTIAL - Abnormal; Notable for the following:    Hemoglobin 11.5 (*)    HCT 34.1 (*)    Neutrophils Relative % 80 (*)    Lymphocytes Relative 11 (*)    All other components within normal limits  URINALYSIS, ROUTINE W REFLEX MICROSCOPIC - Abnormal; Notable for the following:    APPearance CLOUDY (*)    Leukocytes, UA SMALL (*)    All other components within normal limits  URINE MICROSCOPIC-ADD ON - Abnormal; Notable for the following:  Squamous Epithelial / LPF MANY (*)    All other components within normal limits  BASIC METABOLIC PANEL - Abnormal; Notable for the following:    Potassium 3.4 (*)    Glucose, Bld 116 (*)    GFR calc non Af Amer 49 (*)    GFR calc Af Amer 57 (*)    All other components within normal limits  CBC - Abnormal; Notable for the following:    RBC 3.18 (*)    Hemoglobin 9.3 (*)    HCT 27.7 (*)    All other components within normal limits  CBC - Abnormal; Notable for the following:    RBC 3.08 (*)    Hemoglobin 9.1 (*)    HCT 27.1 (*)    Platelets 120 (*)    All other components within normal limits  CREATININE, SERUM - Abnormal; Notable for the following:    GFR calc non Af Amer 62 (*)    GFR calc Af Amer 72 (*)    All other components within normal limits  CBC - Abnormal; Notable for the following:    RBC 2.91 (*)     Hemoglobin 8.6 (*)    HCT 25.0 (*)    Platelets 114 (*)    All other components within normal limits  BASIC METABOLIC PANEL - Abnormal; Notable for the following:    Potassium 3.2 (*)    GFR calc non Af Amer 66 (*)    GFR calc Af Amer 77 (*)    All other components within normal limits  CBC - Abnormal; Notable for the following:    RBC 2.91 (*)    Hemoglobin 8.6 (*)    HCT 24.6 (*)    Platelets 128 (*)    All other components within normal limits  BASIC METABOLIC PANEL - Abnormal; Notable for the following:    Glucose, Bld 177 (*)    GFR calc non Af Amer 71 (*)    GFR calc Af Amer 83 (*)    All other components within normal limits  URINE CULTURE  SURGICAL PCR SCREEN  PROTIME-INR  TYPE AND SCREEN   Imaging Review Dg Hip Operative Right  05/29/2013   CLINICAL DATA:  Femur fracture  EXAM: DG OPERATIVE RIGHT HIP  TECHNIQUE: A single spot fluoroscopic AP image of the right hip is submitted.  COMPARISON:  05/28/2013  FINDINGS: Multiple intraoperative spot images demonstrate placement of a dynamic compression screw and intra medullary rod with a single distal interlocking screw transfixing an intertrochanteric femur fracture. Anatomic alignment. No breakage or loosening of the hardware.  IMPRESSION: ORIF intertrochanteric right femur fracture.   Electronically Signed   By: Maryclare Bean M.D.   On: 05/29/2013 15:40    MDM   1. Hip fracture requiring operative repair, right, closed, initial encounter   2. Dementia   3. Hypothyroidism   4. Protein-calorie malnutrition, severe   Patient is in hospice for dementia. Fall with hip fracture. After discussion with family and Ortho she will have it repaired for pain control. Admitted to internal medicine   Juliet Rude. Rubin Payor, MD 05/31/13 1029

## 2013-05-28 NOTE — Consult Note (Signed)
Reason for Consult:right hip pain Referring Physician: EDP  HPI: Tonya Caldwell is an 72 y.o. female with end stage alzheimer's, home hospice patient, DNR, fel today with immediate evidence of hip pain and inability to bear weight. Family reports poor appetite and profound weight loss over past year.  Past Medical History  Diagnosis Date  . Dementia   . Hypertension   . Hyperlipemia     Past Surgical History  Procedure Laterality Date  . Cholecystectomy    . Abdominal hysterectomy      History reviewed. No pertinent family history.  Social History:  reports that she has quit smoking. She does not have any smokeless tobacco history on file. She reports that she does not drink alcohol or use illicit drugs.  Allergies: No Known Allergies  Medications: med list pending  No results found for this or any previous visit (from the past 48 hour(s)).  Dg Pelvis Portable  05/28/2013   CLINICAL DATA:  Fall, right hip pain  EXAM: PORTABLE PELVIS  COMPARISON:  CT abdomen/ pelvis 03/30/2005  FINDINGS: Acute displaced intertrochanteric fracture. The lesser trochanter appears to exist as an independent fracture fragment. The bones are osteopenic. The visualized bony pelvis is intact. Mild lower lumbar degenerative disc disease. Unremarkable visualized bowel gas pattern.  IMPRESSION: Acute, minimally displaced right intertrochanteric femoral neck fracture.   Electronically Signed   By: Malachy Moan M.D.   On: 05/28/2013 20:26     Vitals Temp:  [99.5 F (37.5 C)] 99.5 F (37.5 C) (10/07 1948) Pulse Rate:  [94-97] 94 (10/07 2124) Resp:  [16-18] 16 (10/07 2124) BP: (141-146)/(87-120) 146/120 mmHg (10/07 2124) SpO2:  [98 %-100 %] 98 % (10/07 2124) Weight:  [54.432 kg (120 lb)] 54.432 kg (120 lb) (10/07 1930) Body mass index is 22.69 kg/(m^2).  Physical Exam: thin WF, aphasic, UE's no gross bone or joint instability, severe pain with attempts at right hip motion, LLE stable. Grossly N/V  intact     Assessment/Plan: Impression:  Right Intertrochanteric hip fracture Profound alzheimer's, under hospice care  Treatment: I have discussed with the patients husband and son the various treatment options and risks versus benefits thereof. I believe operative stabilization provides a good chance for improved pain control, enhanced bed mobility, and overall improved quality of comfort care. They agree with plan for operative intervention which will hopefully take place at noon tomorrow (Wednesday 10/8).  Lorianne Malbrough M 05/28/2013, 10:28 PM

## 2013-05-29 ENCOUNTER — Encounter (HOSPITAL_COMMUNITY): Payer: Self-pay | Admitting: Internal Medicine

## 2013-05-29 ENCOUNTER — Inpatient Hospital Stay (HOSPITAL_COMMUNITY): Payer: Medicare Other | Admitting: Anesthesiology

## 2013-05-29 ENCOUNTER — Encounter (HOSPITAL_COMMUNITY)
Admission: EM | Disposition: A | Discharge status: Skilled Nursing Facility | Payer: Self-pay | Source: Home / Self Care | Attending: Family Medicine

## 2013-05-29 ENCOUNTER — Encounter (HOSPITAL_COMMUNITY): Payer: Medicare Other | Admitting: Anesthesiology

## 2013-05-29 ENCOUNTER — Inpatient Hospital Stay (HOSPITAL_COMMUNITY): Payer: Medicare Other

## 2013-05-29 DIAGNOSIS — E039 Hypothyroidism, unspecified: Secondary | ICD-10-CM | POA: Diagnosis present

## 2013-05-29 DIAGNOSIS — S72009A Fracture of unspecified part of neck of unspecified femur, initial encounter for closed fracture: Secondary | ICD-10-CM

## 2013-05-29 DIAGNOSIS — F0390 Unspecified dementia without behavioral disturbance: Secondary | ICD-10-CM

## 2013-05-29 HISTORY — PX: INTRAMEDULLARY (IM) NAIL INTERTROCHANTERIC: SHX5875

## 2013-05-29 HISTORY — PX: HIP CLOSED REDUCTION: SHX983

## 2013-05-29 LAB — BASIC METABOLIC PANEL
Creatinine, Ser: 1.09 mg/dL (ref 0.50–1.10)
GFR calc Af Amer: 57 mL/min — ABNORMAL LOW (ref >90)
GFR calc non Af Amer: 49 mL/min — ABNORMAL LOW (ref >90)
Potassium: 3.4 mEq/L — ABNORMAL LOW (ref 3.5–5.1)
Sodium: 143 mEq/L (ref 135–145)

## 2013-05-29 LAB — CBC
HCT: 27.1 % — ABNORMAL LOW (ref 36.0–46.0)
Hemoglobin: 9.3 g/dL — ABNORMAL LOW (ref 12.0–15.0)
Hemoglobin: 9.1 g/dL — ABNORMAL LOW (ref 12.0–15.0)
Platelets: ADEQUATE 10*3/uL (ref 150–400)
Platelets: 120 10*3/uL — ABNORMAL LOW (ref 150–400)
RBC: 3.18 MIL/uL — ABNORMAL LOW (ref 3.87–5.11)
RBC: 3.08 MIL/uL — ABNORMAL LOW (ref 3.87–5.11)
RDW: 13.6 % (ref 11.5–15.5)
WBC: 7.2 10*3/uL (ref 4.0–10.5)

## 2013-05-29 LAB — CREATININE, SERUM
Creatinine, Ser: 0.9 mg/dL (ref 0.50–1.10)
GFR calc Af Amer: 72 mL/min — ABNORMAL LOW (ref >90)
GFR calc non Af Amer: 62 mL/min — ABNORMAL LOW (ref >90)

## 2013-05-29 LAB — SURGICAL PCR SCREEN
MRSA, PCR: NEGATIVE
Staphylococcus aureus: NEGATIVE

## 2013-05-29 LAB — TYPE AND SCREEN
ABO/RH(D): A NEG
Antibody Screen: POSITIVE

## 2013-05-29 SURGERY — FIXATION, FRACTURE, INTERTROCHANTERIC, WITH INTRAMEDULLARY ROD
Anesthesia: General | Laterality: Right | Wound class: Clean

## 2013-05-29 MED ORDER — ACETAMINOPHEN 500 MG PO TABS: 500.0000 mg | ORAL_TABLET | Freq: Four times a day (QID) | ORAL | Status: AC | PRN

## 2013-05-29 MED ORDER — ENOXAPARIN SODIUM 30 MG/0.3ML ~~LOC~~ SOLN
30.0000 mg | SUBCUTANEOUS | Status: DC
  Administered 2013-05-30 – 2013-06-03 (×5): 30 mg via SUBCUTANEOUS
  Filled 2013-05-29 (×7): qty 0.3

## 2013-05-29 MED ORDER — METOCLOPRAMIDE HCL 5 MG PO TABS
5.0000 mg | ORAL_TABLET | Freq: Three times a day (TID) | ORAL | Status: DC | PRN
  Filled 2013-05-29: qty 2

## 2013-05-29 MED ORDER — GLYCOPYRROLATE 0.2 MG/ML IJ SOLN
INTRAMUSCULAR | Status: DC | PRN
  Administered 2013-05-29: 0.6 mg via INTRAVENOUS

## 2013-05-29 MED ORDER — LIDOCAINE HCL (CARDIAC) 20 MG/ML IV SOLN
INTRAVENOUS | Status: DC | PRN
  Administered 2013-05-29: 70 mg via INTRAVENOUS

## 2013-05-29 MED ORDER — HYDROCODONE-ACETAMINOPHEN 5-325 MG PO TABS
1.0000 | ORAL_TABLET | Freq: Four times a day (QID) | ORAL | Status: DC | PRN
  Filled 2013-05-29: qty 1

## 2013-05-29 MED ORDER — POTASSIUM CHLORIDE 2 MEQ/ML IV SOLN
INTRAVENOUS | Status: DC
  Administered 2013-05-29: 10:00:00 via INTRAVENOUS
  Filled 2013-05-29 (×2): qty 1000

## 2013-05-29 MED ORDER — POLYETHYLENE GLYCOL 3350 17 G PO PACK: 17.0000 g | PACK | Freq: Every day | ORAL | Status: DC | PRN

## 2013-05-29 MED ORDER — STERILE WATER FOR IRRIGATION IR SOLN
Status: DC | PRN
  Administered 2013-05-29: 1000 mL

## 2013-05-29 MED ORDER — ONDANSETRON HCL 4 MG/2ML IJ SOLN: 4.0000 mg | Freq: Four times a day (QID) | INTRAMUSCULAR | Status: DC | PRN

## 2013-05-29 MED ORDER — FENTANYL CITRATE 0.05 MG/ML IJ SOLN
INTRAMUSCULAR | Status: DC | PRN
  Administered 2013-05-29: 25 ug via INTRAVENOUS
  Administered 2013-05-29 (×3): 50 ug via INTRAVENOUS
  Administered 2013-05-29: 25 ug via INTRAVENOUS

## 2013-05-29 MED ORDER — NEOSTIGMINE METHYLSULFATE 1 MG/ML IJ SOLN
INTRAMUSCULAR | Status: DC | PRN
  Administered 2013-05-29: 3 mg via INTRAVENOUS

## 2013-05-29 MED ORDER — CEFAZOLIN SODIUM-DEXTROSE 2-3 GM-% IV SOLR
INTRAVENOUS | Status: AC
  Filled 2013-05-29: qty 50

## 2013-05-29 MED ORDER — BISACODYL 5 MG PO TBEC
5.0000 mg | DELAYED_RELEASE_TABLET | Freq: Every day | ORAL | Status: DC | PRN
  Administered 2013-05-30: 5 mg via ORAL
  Filled 2013-05-29: qty 1

## 2013-05-29 MED ORDER — ONDANSETRON HCL 4 MG/2ML IJ SOLN
INTRAMUSCULAR | Status: DC | PRN
  Administered 2013-05-29: 4 mg via INTRAMUSCULAR

## 2013-05-29 MED ORDER — PHENYLEPHRINE HCL 10 MG/ML IJ SOLN
INTRAMUSCULAR | Status: DC | PRN
  Administered 2013-05-29 (×2): 80 ug via INTRAVENOUS
  Administered 2013-05-29: 40 ug via INTRAVENOUS

## 2013-05-29 MED ORDER — ASPIRIN EC 325 MG PO TBEC: 325.0000 mg | DELAYED_RELEASE_TABLET | Freq: Every day | ORAL | Status: AC

## 2013-05-29 MED ORDER — PHENOL 1.4 % MT LIQD: 1.0000 | OROMUCOSAL | Status: DC | PRN

## 2013-05-29 MED ORDER — HYDROCODONE-ACETAMINOPHEN 5-325 MG PO TABS
1.0000 | ORAL_TABLET | Freq: Four times a day (QID) | ORAL | Status: DC | PRN
  Administered 2013-06-01 – 2013-06-03 (×2): 1 via ORAL
  Filled 2013-05-29: qty 2
  Filled 2013-05-29: qty 1

## 2013-05-29 MED ORDER — METOCLOPRAMIDE HCL 5 MG/ML IJ SOLN: 5.0000 mg | Freq: Three times a day (TID) | INTRAMUSCULAR | Status: DC | PRN

## 2013-05-29 MED ORDER — SODIUM CHLORIDE 0.9 % IV SOLN: INTRAVENOUS | Status: DC

## 2013-05-29 MED ORDER — INFLUENZA VAC SPLIT QUAD 0.5 ML IM SUSP
0.5000 mL | INTRAMUSCULAR | Status: AC
  Administered 2013-05-30: 0.5 mL via INTRAMUSCULAR
  Filled 2013-05-29: qty 0.5

## 2013-05-29 MED ORDER — CEFAZOLIN SODIUM-DEXTROSE 2-3 GM-% IV SOLR
INTRAVENOUS | Status: DC | PRN
  Administered 2013-05-29: 2 g via INTRAVENOUS

## 2013-05-29 MED ORDER — DOCUSATE SODIUM 100 MG PO CAPS
100.0000 mg | ORAL_CAPSULE | Freq: Two times a day (BID) | ORAL | Status: DC
  Administered 2013-05-30 – 2013-06-02 (×5): 100 mg via ORAL
  Filled 2013-05-29 (×12): qty 1

## 2013-05-29 MED ORDER — PROPOFOL 10 MG/ML IV BOLUS
INTRAVENOUS | Status: DC | PRN
  Administered 2013-05-29: 120 mg via INTRAVENOUS

## 2013-05-29 MED ORDER — DEXTROSE 5 % IV SOLN
INTRAVENOUS | Status: DC
  Administered 2013-05-29: 04:00:00 via INTRAVENOUS

## 2013-05-29 MED ORDER — ACETAMINOPHEN 325 MG PO TABS
650.0000 mg | ORAL_TABLET | Freq: Four times a day (QID) | ORAL | Status: DC | PRN
  Administered 2013-05-29 – 2013-05-31 (×5): 650 mg via ORAL
  Filled 2013-05-29 (×5): qty 2

## 2013-05-29 MED ORDER — ONDANSETRON HCL 4 MG PO TABS: 4.0000 mg | ORAL_TABLET | Freq: Four times a day (QID) | ORAL | Status: DC | PRN

## 2013-05-29 MED ORDER — FENTANYL CITRATE 0.05 MG/ML IJ SOLN
25.0000 ug | INTRAMUSCULAR | Status: DC | PRN
  Administered 2013-05-29: 50 ug via INTRAVENOUS

## 2013-05-29 MED ORDER — ACETAMINOPHEN 650 MG RE SUPP: 650.0000 mg | Freq: Four times a day (QID) | RECTAL | Status: DC | PRN

## 2013-05-29 MED ORDER — MORPHINE SULFATE 2 MG/ML IJ SOLN
0.5000 mg | INTRAMUSCULAR | Status: DC | PRN
  Filled 2013-05-29: qty 1

## 2013-05-29 MED ORDER — 0.9 % SODIUM CHLORIDE (POUR BTL) OPTIME
TOPICAL | Status: DC | PRN
  Administered 2013-05-29: 1000 mL

## 2013-05-29 MED ORDER — CEFAZOLIN SODIUM-DEXTROSE 2-3 GM-% IV SOLR
2.0000 g | Freq: Four times a day (QID) | INTRAVENOUS | Status: AC
  Administered 2013-05-29 (×2): 2 g via INTRAVENOUS
  Filled 2013-05-29 (×2): qty 50

## 2013-05-29 MED ORDER — MORPHINE SULFATE 2 MG/ML IJ SOLN
0.5000 mg | INTRAMUSCULAR | Status: DC | PRN
  Administered 2013-06-02: 0.5 mg via INTRAVENOUS

## 2013-05-29 MED ORDER — ROCURONIUM BROMIDE 100 MG/10ML IV SOLN
INTRAVENOUS | Status: DC | PRN
  Administered 2013-05-29: 30 mg via INTRAVENOUS
  Administered 2013-05-29: 20 mg via INTRAVENOUS

## 2013-05-29 MED ORDER — FLEET ENEMA 7-19 GM/118ML RE ENEM: 1.0000 | ENEMA | Freq: Once | RECTAL | Status: AC | PRN

## 2013-05-29 MED ORDER — FENTANYL CITRATE 0.05 MG/ML IJ SOLN
INTRAMUSCULAR | Status: AC
  Filled 2013-05-29: qty 2

## 2013-05-29 MED ORDER — LEVOTHYROXINE SODIUM 88 MCG PO TABS
88.0000 ug | ORAL_TABLET | Freq: Every day | ORAL | Status: DC
  Administered 2013-05-30 – 2013-06-03 (×5): 88 ug via ORAL
  Filled 2013-05-29 (×7): qty 1

## 2013-05-29 MED ORDER — METOCLOPRAMIDE HCL 5 MG/ML IJ SOLN: 10.0000 mg | Freq: Once | INTRAMUSCULAR | Status: DC | PRN

## 2013-05-29 MED ORDER — MENTHOL 3 MG MT LOZG: 1.0000 | LOZENGE | OROMUCOSAL | Status: DC | PRN

## 2013-05-29 MED ORDER — LACTATED RINGERS IV SOLN
INTRAVENOUS | Status: DC
  Administered 2013-05-29 – 2013-06-01 (×4): via INTRAVENOUS

## 2013-05-29 SURGICAL SUPPLY — 39 items
BANDAGE CONFORM 3  STR LF (GAUZE/BANDAGES/DRESSINGS) ×4 IMPLANT
BIT DRILL CANN LG 4.3MM (BIT) IMPLANT
BLADE SURG 15 STRL LF DISP TIS (BLADE) ×1 IMPLANT
BLADE SURG 15 STRL SS (BLADE) ×2
CLOTH BEACON ORANGE TIMEOUT ST (SAFETY) ×2 IMPLANT
DRAPE STERI IOBAN 125X83 (DRAPES) ×2 IMPLANT
DRILL BIT CANN LG 4.3MM (BIT) ×2
DRSG ADAPTIC 3X8 NADH LF (GAUZE/BANDAGES/DRESSINGS) ×2 IMPLANT
DRSG MEPILEX BORDER 4X4 (GAUZE/BANDAGES/DRESSINGS) ×3 IMPLANT
DRSG MEPILEX BORDER 4X8 (GAUZE/BANDAGES/DRESSINGS) ×2 IMPLANT
ELECT REM PT RETURN 9FT ADLT (ELECTROSURGICAL) ×2
ELECTRODE REM PT RTRN 9FT ADLT (ELECTROSURGICAL) ×1 IMPLANT
GLOVE BIO SURGEON STRL SZ7.5 (GLOVE) ×3 IMPLANT
GLOVE BIO SURGEON STRL SZ8 (GLOVE) ×3 IMPLANT
GLOVE EUDERMIC 7 POWDERFREE (GLOVE) ×3 IMPLANT
GLOVE SS BIOGEL STRL SZ 7.5 (GLOVE) ×1 IMPLANT
GLOVE SUPERSENSE BIOGEL SZ 7.5 (GLOVE) ×2
GOWN STRL NON-REIN LRG LVL3 (GOWN DISPOSABLE) ×2 IMPLANT
GOWN STRL REIN XL XLG (GOWN DISPOSABLE) ×4 IMPLANT
GUIDEWIRE BALL NOSE 100CM (WIRE) ×1 IMPLANT
KIT BASIN OR (CUSTOM PROCEDURE TRAY) ×2 IMPLANT
KIT ROOM TURNOVER OR (KITS) ×2 IMPLANT
MANIFOLD NEPTUNE II (INSTRUMENTS) ×2 IMPLANT
NAIL HIP FRACT 130D 11X180 (Screw) ×1 IMPLANT
NS IRRIG 1000ML POUR BTL (IV SOLUTION) ×2 IMPLANT
PACK GENERAL/GYN (CUSTOM PROCEDURE TRAY) ×2 IMPLANT
PAD ARMBOARD 7.5X6 YLW CONV (MISCELLANEOUS) ×4 IMPLANT
SCREW BONE CORTICAL 5.0X3 (Screw) ×1 IMPLANT
SCREW LAG HIP NAIL 10.5X95 (Screw) ×1 IMPLANT
SPONGE LAP 18X18 X RAY DECT (DISPOSABLE) ×2 IMPLANT
STAPLER VISISTAT 35W (STAPLE) ×4 IMPLANT
STRIP CLOSURE SKIN 1/2X4 (GAUZE/BANDAGES/DRESSINGS) ×2 IMPLANT
SUT MNCRL AB 3-0 PS2 18 (SUTURE) ×2 IMPLANT
SUT VIC AB 0 CT1 27 (SUTURE) ×2
SUT VIC AB 0 CT1 27XBRD ANBCTR (SUTURE) ×1 IMPLANT
SUT VIC AB 2-0 CT1 27 (SUTURE) ×2
SUT VIC AB 2-0 CT1 TAPERPNT 27 (SUTURE) ×1 IMPLANT
TRAY FOLEY CATH 16FRSI W/METER (SET/KITS/TRAYS/PACK) IMPLANT
WATER STERILE IRR 1000ML POUR (IV SOLUTION) ×2 IMPLANT

## 2013-05-29 NOTE — Op Note (Signed)
05/28/2013 - 05/29/2013  1:09 PM  PATIENT:   Tonya Caldwell  72 y.o. female  PRE-OPERATIVE DIAGNOSIS:  Right intertroch fracture  POST-OPERATIVE DIAGNOSIS:  same  PROCEDURE:  ORIF  SURGEON:  Jacara Benito, Vania Rea. M.D.  ASSISTANTS: Shuford pac   ANESTHESIA:   GET  EBL: 200cc  SPECIMEN:  none  Drains: none   PATIENT DISPOSITION:  PACU - hemodynamically stable.    PLAN OF CARE: Admit to inpatient  WBAT for tranfers Resume DNR  Dictation# [id number]

## 2013-05-29 NOTE — Progress Notes (Signed)
INITIAL NUTRITION ASSESSMENT  DOCUMENTATION CODES Per approved criteria  -Not Applicable   INTERVENTION: None at this time  NUTRITION DIAGNOSIS: Increased nutrient needs related to surgery as evidenced by estimated needs.   Goal: Pt to meet >/= 90% of their estimated nutrition needs   Monitor:  Diet advancement, PO intake, weight trend  Reason for Assessment: Hip Fracture Consult   72 y.o. female  Admitting Dx: Hip fracture requiring operative repair  ASSESSMENT: Pt admitted for surgical repair of a hip fracture after a fall. Pt is a hospice patient and has a hx of dementia.  Pt's potassium is low and being replaced via IVF. Pt is currently in PACU after surgery. No family in room.   Height: Ht Readings from Last 1 Encounters:  05/28/13 5\' 1"  (1.549 m)    Weight: Wt Readings from Last 1 Encounters:  05/28/13 120 lb (54.432 kg)    Ideal Body Weight: 47.7 kg   % Ideal Body Weight: 114%  Wt Readings from Last 10 Encounters:  05/28/13 120 lb (54.432 kg)  05/28/13 120 lb (54.432 kg)    Usual Body Weight: unknown  % Usual Body Weight: -  BMI:  Body mass index is 22.69 kg/(m^2).  Estimated Nutritional Needs: Kcal: 1400-1500 Protein: 65-75 grams Fluid: > 1.5L/day  Skin: incision right hip; left leg abrasion  Diet Order: NPO  EDUCATION NEEDS: -No education needs identified at this time   Intake/Output Summary (Last 24 hours) at 05/29/13 1044 Last data filed at 05/29/13 0700  Gross per 24 hour  Intake    225 ml  Output    300 ml  Net    -75 ml    Last BM: PTA   Labs:   Recent Labs Lab 05/28/13 2256 05/29/13 0600  NA 147* 143  K 3.5 3.4*  CL 107 108  CO2 29 27  BUN 12 12  CREATININE 1.21* 1.09  CALCIUM 9.4 8.9  GLUCOSE 160* 116*    CBG (last 3)  No results found for this basename: GLUCAP,  in the last 72 hours  Scheduled Meds: . levothyroxine  88 mcg Oral QAC breakfast    Continuous Infusions: . dextrose 5 % 1,000 mL with  potassium chloride 20 mEq infusion 75 mL/hr at 05/29/13 0931    Past Medical History  Diagnosis Date  . Dementia   . Hypertension   . Hyperlipemia     Past Surgical History  Procedure Laterality Date  . Cholecystectomy    . Abdominal hysterectomy      Kendell Bane RD, LDN, CNSC [phone] Pager [phone] After Hours Pager

## 2013-05-29 NOTE — H&P (Signed)
[facility] History and Physical  Tonya Caldwell NWG:[id number] DOB: [birth year] DOA: 05/28/2013  Referring physician: ER physician. PCP: Ancil Boozer, MD  Specialists: None.  History obtained from patient's husband as patient has dementia.  Chief Complaint: Fall.  HPI: Tonya Caldwell is a 72 y.o. female with history of dementia and hypothyroidism had a fall at her house after patient slipped and fell. Patient was brought to the ER and x-rays revealed right hip fracture. Patient is under hospice and is DO NOT RESUSCITATE. At this time surgery has been planned by orthopedic surgeon as a palliative measure. Patient otherwise does not contribute anything to the history due to the dementia. As per patient's husband patient did not hit her head or lose consciousness.  Review of Systems: As presented in the history of presenting illness, rest negative.  Past Medical History  Diagnosis Date  . Dementia   . Hypertension   . Hyperlipemia    Past Surgical History  Procedure Laterality Date  . Cholecystectomy    . Abdominal hysterectomy     Social History:  reports that she has quit smoking. She does not have any smokeless tobacco history on file. She reports that she does not drink alcohol or use illicit drugs. Home. where does patient live-- No. Can patient participate in ADLs?  No Known Allergies  History reviewed. No pertinent family history.    Prior to Admission medications   Medication Sig Start Date End Date Taking? Authorizing Provider  aspirin 81 MG tablet Take 81 mg by mouth daily.   Yes Historical Provider, MD  furosemide (LASIX) 20 MG tablet Take 20 mg by mouth daily.   Yes Historical Provider, MD  levothyroxine (SYNTHROID, LEVOTHROID) 88 MCG tablet Take 88 mcg by mouth daily. 02/15/13  Yes Historical Provider, MD  LORazepam (ATIVAN) 1 MG tablet Take 0.5-1 mg by mouth every 8 (eight) hours as needed for anxiety.   Yes Historical Provider, MD   Physical Exam: Filed  Vitals:   05/28/13 1930 05/28/13 1948 05/28/13 2124 05/28/13 2244  BP: 141/87 141/87 146/120 132/107  Pulse: 97 95 94 102  Temp:  99.5 F (37.5 C)  100.4 F (38 C)  TempSrc:  Oral  Rectal  Resp:  18 16 16   Height: 5\' 1"  (1.549 m)     Weight: 54.432 kg (120 lb)     SpO2: 100% 98% 98% 99%     General:  Well-developed and moderately nourished.  Eyes: Anicteric no pallor.  ENT: No discharge from ears eyes nose mouth.  Neck: No mass felt.  Cardiovascular: S1-S2 heard.  Respiratory: No rhonchi or crepitations.  Abdomen: Soft nontender bowel sounds present.  Skin: No rash.  Musculoskeletal: Pain on moving right hip.  Psychiatric: Patient is demented.  Neurologic: Patient is demented.  Labs on Admission:  Basic Metabolic Panel:  Recent Labs Lab 05/28/13 2256  NA 147*  K 3.5  CL 107  CO2 29  GLUCOSE 160*  BUN 12  CREATININE 1.21*  CALCIUM 9.4   Liver Function Tests: No results found for this basename: AST, ALT, ALKPHOS, BILITOT, PROT, ALBUMIN,  in the last 168 hours No results found for this basename: LIPASE, AMYLASE,  in the last 168 hours No results found for this basename: AMMONIA,  in the last 168 hours CBC:  Recent Labs Lab 05/28/13 2256  WBC 9.2  NEUTROABS 7.3  HGB 11.5*  HCT 34.1*  MCV 87.0  PLT 177   Cardiac Enzymes: No results found for this basename: CKTOTAL,  CKMB, CKMBINDEX, TROPONINI,  in the last 168 hours  BNP (last 3 results) No results found for this basename: PROBNP,  in the last 8760 hours CBG: No results found for this basename: GLUCAP,  in the last 168 hours  Radiological Exams on Admission: Dg Pelvis Portable  05/28/2013   CLINICAL DATA:  Fall, right hip pain  EXAM: PORTABLE PELVIS  COMPARISON:  CT abdomen/ pelvis 03/30/2005  FINDINGS: Acute displaced intertrochanteric fracture. The lesser trochanter appears to exist as an independent fracture fragment. The bones are osteopenic. The visualized bony pelvis is intact. Mild lower  lumbar degenerative disc disease. Unremarkable visualized bowel gas pattern.  IMPRESSION: Acute, minimally displaced right intertrochanteric femoral neck fracture.   Electronically Signed   By: Malachy Moan M.D.   On: 05/28/2013 20:26   Dg Chest Port 1 View  05/28/2013   *RADIOLOGY REPORT*  Clinical Data: Status post fall; preoperative chest radiograph.  PORTABLE CHEST - 1 VIEW  Comparison: Chest radiograph performed 02/19/2007  Findings: The lungs are well-aerated and clear.  There is no evidence of focal opacification, pleural effusion or pneumothorax.  The cardiomediastinal silhouette is borderline normal in size.  No acute osseous abnormalities are seen.  IMPRESSION: No acute cardiopulmonary process seen.  No displaced rib fractures identified.   Original Report Authenticated By: Tonia Ghent, M.D.     Assessment/Plan Principal Problem:   Hip fracture requiring operative repair Active Problems:   Dementia   Hypothyroidism   1. Right hip fracture status post mechanical fall - right hip surgery is planned later today as a palliative measure. Further recommendations per surgery. 2. Hypernatremia and renal failure probably from dehydration - gently hydrate with D5W and follow metabolic panel closely. Hold Lasix. 3. Dementia - not acute issues at this time. 4. Hypothyroidism - continue Synthroid.    Code Status: DO NOT RESUSCITATE as confirmed the patient's husband.  Family Communication: Patient's husband at the bedside.  Disposition Plan: Admit to inpatient.    KAKRAKANDY,ARSHAD N. [facility] Pager [phone].  If 7PM-7AM, please contact night-coverage [email or web address] Password [email or web address] 05/29/2013, 2:50 AM

## 2013-05-29 NOTE — Progress Notes (Signed)
Subjective: Patient admitted this morning on 10/8 with right hip fracture. Filed Vitals:   05/29/13 1611  BP: 152/56  Pulse:   Temp: 97.3 F (36.3 C)  Resp: 16    Chest: Clear Bilaterally Heart : S1S2 RRR Abdomen: Soft, nontender Ext : No edema Neuro: Alert, non communicative  A/P Right hip fracture status post mechanical fall - right hip surgery is planned later today as a palliative measure. Further recommendations per surgery.  Hypernatremia and renal failure probably from dehydration - gently hydrate with D5W and follow metabolic panel closely. Hold Lasix.  Dementia - not acute issues at this time.  Hypothyroidism - continue Synthroid Hypokalemia- Change the IV fluids to D5W + 20 meq kcl at 100 ml/hr    Meredeth Ide [facility] Pager- [phone]

## 2013-05-29 NOTE — Progress Notes (Signed)
Utilization review completed.  

## 2013-05-29 NOTE — Transfer of Care (Signed)
Immediate Anesthesia Transfer of Care Note  Patient: Tonya Caldwell  Procedure(s) Performed: Procedure(s): INTRAMEDULLARY (IM) NAIL INTERTROCHANTRIC (Right)  Patient Location: PACU  Anesthesia Type:General  Level of Consciousness: sedated  Airway & Oxygen Therapy: Patient Spontanous Breathing and Patient connected to nasal cannula oxygen  Post-op Assessment: Report given to PACU RN and Post -op Vital signs reviewed and stable  Post vital signs: stable  Complications: No apparent anesthesia complications

## 2013-05-29 NOTE — Progress Notes (Signed)
Tonya Caldwell  MRN: [id number] DOB/Age: Dec 23, 1940 72 y.o. Physician: Lynnea Maizes, M.D. Day of Surgery Procedure(s) (LRB): INTRAMEDULLARY (IM) NAIL INTERTROCHANTRIC (Right)  Subjective: Stable over night Vital Signs Temp:  [97.8 F (36.6 C)-100.5 F (38.1 C)] 100.5 F (38.1 C) (10/08 1040) Pulse Rate:  [74-102] 87 (10/08 1040) Resp:  [16-18] 18 (10/08 1040) BP: (132-147)/(69-120) 141/96 mmHg (10/08 1040) SpO2:  [96 %-100 %] 96 % (10/08 1040) Weight:  [54.432 kg (120 lb)] 54.432 kg (120 lb) (10/07 1930)  Lab Results  Recent Labs  05/28/13 2256 05/29/13 0600  WBC 9.2 5.9  HGB 11.5* 9.3*  HCT 34.1* 27.7*  PLT 177 PLATELET CLUMPS NOTED ON SMEAR, COUNT APPEARS ADEQUATE   BMET  Recent Labs  05/28/13 2256 05/29/13 0600  NA 147* 143  K 3.5 3.4*  CL 107 108  CO2 29 27  GLUCOSE 160* 116*  BUN 12 12  CREATININE 1.21* 1.09  CALCIUM 9.4 8.9   INR  Date Value Range Status  05/28/2013 1.00  0.00 - 1.49 Final     Exam  As noted on admission. More comfortable this am  Plan To OR for ORIF. Plan discussed with family  Kenneth Cuaresma M 05/29/2013, 12:12 PM

## 2013-05-29 NOTE — Anesthesia Postprocedure Evaluation (Signed)
  Anesthesia Post-op Note  Patient: Tonya Caldwell  Procedure(s) Performed: Procedure(s): INTRAMEDULLARY (IM) NAIL INTERTROCHANTRIC (Right)  Patient Location: PACU  Anesthesia Type:General  Level of Consciousness: confused  Airway and Oxygen Therapy: Patient Spontanous Breathing and Patient connected to nasal cannula oxygen  Post-op Pain: mild  Post-op Assessment: Post-op Vital signs reviewed, Patient's Cardiovascular Status Stable, Respiratory Function Stable, Patent Airway, No signs of Nausea or vomiting, Adequate PO intake and Pain level controlled  Post-op Vital Signs: Reviewed and stable  Complications: No apparent anesthesia complications

## 2013-05-29 NOTE — Anesthesia Preprocedure Evaluation (Signed)
Anesthesia Evaluation  Patient identified by MRN, date of birth, ID band Patient confused    Reviewed: Allergy & Precautions, H&P , NPO status , Patient's Chart, lab work & pertinent test results, reviewed documented beta blocker date and time   Airway Mallampati: II TM Distance: >3 FB Neck ROM: full    Dental   Pulmonary neg pulmonary ROS,  breath sounds clear to auscultation        Cardiovascular hypertension, Rhythm:regular     Neuro/Psych PSYCHIATRIC DISORDERS negative neurological ROS     GI/Hepatic negative GI ROS, Neg liver ROS,   Endo/Other  Hypothyroidism   Renal/GU negative Renal ROS  negative genitourinary   Musculoskeletal   Abdominal   Peds  Hematology negative hematology ROS (+)   Anesthesia Other Findings See surgeon's H&P   Reproductive/Obstetrics negative OB ROS                           Anesthesia Physical Anesthesia Plan  ASA: III  Anesthesia Plan: General   Post-op Pain Management:    Induction: Intravenous  Airway Management Planned: Oral ETT  Additional Equipment:   Intra-op Plan:   Post-operative Plan: Extubation in OR  Informed Consent: I have reviewed the patients History and Physical, chart, labs and discussed the procedure including the risks, benefits and alternatives for the proposed anesthesia with the patient or authorized representative who has indicated his/her understanding and acceptance.   Dental Advisory Given  Plan Discussed with: CRNA and Surgeon  Anesthesia Plan Comments:         Anesthesia Quick Evaluation

## 2013-05-30 DIAGNOSIS — E43 Unspecified severe protein-calorie malnutrition: Secondary | ICD-10-CM | POA: Insufficient documentation

## 2013-05-30 LAB — BASIC METABOLIC PANEL
GFR calc Af Amer: 77 mL/min — ABNORMAL LOW (ref >90)
GFR calc non Af Amer: 66 mL/min — ABNORMAL LOW (ref >90)
Glucose, Bld: 94 mg/dL (ref 70–99)
Potassium: 3.2 mEq/L — ABNORMAL LOW (ref 3.5–5.1)
Sodium: 140 mEq/L (ref 135–145)

## 2013-05-30 LAB — CBC
Hemoglobin: 8.6 g/dL — ABNORMAL LOW (ref 12.0–15.0)
MCHC: 34.4 g/dL (ref 30.0–36.0)
RDW: 13.2 % (ref 11.5–15.5)
WBC: 6 10*3/uL (ref 4.0–10.5)

## 2013-05-30 LAB — URINE CULTURE
Colony Count: NO GROWTH
Culture: NO GROWTH

## 2013-05-30 MED ORDER — POTASSIUM CHLORIDE CRYS ER 20 MEQ PO TBCR
40.0000 meq | EXTENDED_RELEASE_TABLET | ORAL | Status: AC
  Administered 2013-05-30 (×2): 40 meq via ORAL
  Filled 2013-05-30 (×2): qty 2

## 2013-05-30 NOTE — Evaluation (Signed)
Physical Therapy Evaluation Patient Details Name: Tonya Caldwell MRN: [id number] DOB: 10/06/1940 Today's Date: 05/30/2013 Time: 7829-5621 PT Time Calculation (min): 37 min  PT Assessment / Plan / Recommendation History of Present Illness  Pt. was being cared for in the home by husband.  She slipped and fell , sustaining a R IT hip fx. and is now s/p ORIF.  Pt. has decreased mobility and gait.  She is under hospice care and has late stage dementia.  Husband reports she is combative quite often.  Discussed PT plan with husband and gave him the option of [place] attempting PT vs. no PT.  He elects to trial PT to see if pt. can tolerate.  He states he wants to "see if it helps her".   Clinical Impression  Pt. With decreased mobility from hip fx. And s/p surgery.  Also limited by dementia.  Will benefit from trial to PT to see if she can tolerate and make any progress.  This plan was made with pt's husband.    PT Assessment  Patient needs continued PT services    Follow Up Recommendations  SNF;Supervision/Assistance - 24 hour;Supervision for mobility/OOB    Does the patient have the potential to tolerate intense rehabilitation      Barriers to Discharge Decreased caregiver support (pt. not able to handle her at this level)      Equipment Recommendations  None recommended by PT    Recommendations for Other Services     Frequency Min 3X/week    Precautions / Restrictions Precautions Precautions: Posterior Hip;Other (comment) ("if hemiarthroplasty") Precaution Booklet Issued: No Restrictions Weight Bearing Restrictions: Yes RLE Weight Bearing: Weight bearing as tolerated   Pertinent Vitals/Pain See vitals tab       Mobility  Bed Mobility Bed Mobility: Supine to Sit;Sitting - Scoot to Edge of Bed;Sit to Supine Supine to Sit: 1: +2 Total assist;HOB flat Supine to Sit: Patient Percentage: 0% Sitting - Scoot to Edge of Bed: 1: +2 Total assist Sitting - Scoot to Edge of Bed: Patient  Percentage: 0% Sit to Supine: 1: +2 Total assist Sit to Supine: Patient Percentage: 0% Details for Bed Mobility Assistance: Pt. required 2 assist with use of bed pad to facilitate mobility.  Pt. not able to proveide any assist in the process. Transfers Transfers: Sit to Stand;Stand to Sit Sit to Stand: 1: +2 Total assist;From bed;With upper extremity assist Sit to Stand: Patient Percentage: 30% Details for Transfer Assistance: Pt. made several attempts to stand while sitting at edge of bed and could begin to clear hips but unable to  progress further due to increased agitation.  Pt. assisted to sitting back on edge of bed then back to supine position. Ambulation/Gait Ambulation/Gait Assistance: Not tested (comment)    Exercises     PT Diagnosis: Difficulty walking;Abnormality of gait;Acute pain  PT Problem List: Decreased activity tolerance;Decreased balance;Decreased mobility;Decreased knowledge of use of DME;Decreased safety awareness;Decreased knowledge of precautions;Pain;Decreased cognition PT Treatment Interventions: DME instruction;Gait training;Functional mobility training;Therapeutic activities;Therapeutic exercise;Balance training;Patient/family education     PT Goals(Current goals can be found in the care plan section) Acute Rehab PT Goals Patient Stated Goal: pt. could not provide goal; husband wants to proceed with trial of PT to pt's tolerance and wants her to transfer to [facility] upon DC from [place]. PT Goal Formulation: With family Time For Goal Achievement: 06/06/13 Potential to Achieve Goals: Fair  Visit Information  Last PT Received On: 05/30/13 Assistance Needed: +2 History of Present Illness:  Pt. was being cared for in the home by husband.  She slipped and fell , sustaining a R IT hip fx. and is now s/p ORIF.  Pt. has decreased mobility and gait.  She is under hospice care and has lae stage dementia.  Husband reports she is combative quite often.   Discussed PT plan with husband and gave him the option of [place] attempting PT vs. no PT.  He elects to trial PT to see if pt. can tolerate.  He states he wants to "see if it helps her".        Prior Functioning  Home Living Family/patient expects to be discharged to:: Hospice/Palliative care Prior Function Level of Independence: Needs assistance Communication Communication: Other (comment) (typical for  late stage dementia)    Cognition  Cognition Arousal/Alertness: Lethargic Behavior During Therapy: Agitated Overall Cognitive Status: History of cognitive impairments - at baseline Memory: Decreased recall of precautions;Decreased short-term memory    Extremity/Trunk Assessment Upper Extremity Assessment Upper Extremity Assessment: Overall WFL for tasks assessed Lower Extremity Assessment Lower Extremity Assessment: RLE deficits/detail RLE: Unable to fully assess due to pain (and due to cognitive status)   Balance Balance Balance Assessed: Yes Static Sitting Balance Static Sitting - Balance Support: Bilateral upper extremity supported;Feet supported Static Sitting - Level of Assistance: 5: Stand by assistance Static Sitting - Comment/# of Minutes: 7  End of Session PT - End of Session Activity Tolerance: Treatment limited secondary to agitation;Patient limited by pain Patient left: in bed;with call bell/phone within reach;with nursing/sitter in room;with family/visitor present;with bed alarm set Nurse Communication: Mobility status;Precautions;Weight bearing status  GP     Ferman Hamming 05/30/2013, 10:41 AM Weldon Picking PT Acute Rehab Services [phone] Bee[facility] [phone]

## 2013-05-30 NOTE — Progress Notes (Signed)
Patient without spontaneous void this shift.  Fluids encouraged with little success.  Patient unable to verbalize need to use the bathroom or feeling any fullness of her bladder.  Bladder scan showed 368, I+O cath performed per post op Foley d/c protocol, 275 ml of urine drained.  Fluids continue to be encouraged.  Nursing will continue to monitor.

## 2013-05-30 NOTE — Progress Notes (Signed)
[facility] PROGRESS NOTE  Tonya Caldwell ZOX:[id number] DOB: 07/08/1941 DOA: 05/28/2013 PCP: Ancil Boozer, MD  Assessment/Plan: 1. Right hip fracture status post mechanical fall-patient underwent open reduction and internal fixation, continue Vicodin when necessary for pain.  2. Dementia-patient has baseline remains mildly agitated, both Haldol and Ativan have been tried in the past and Haldol caused the patient to become immobile with patient unable to walk and Ativan caused opposite effect of worsening agitation and confusion. Would avoid both Ativan and Haldol at this time unless patient becomes severely agitated at that time when necessary Haldol can be given.  3. Hyponatremia-sodium is improving, we'll continue with IV hydration, mostly due to dehydration  4. Hypokalemia-we'll replace potassium  5. Hypothyroidism-continue with Synthroid  6. Palliative care discussion-goals of care meeting was done with the patient's son and her husband, discussed the CODE STATUS, artificial nutrition hydration, comfort care approach, disposition options. At this time patient is total comfort care, plan to go to skilled nursing facility for possible rehabilitation and then family is hopeful of taking patient home on home hospice. They do not want to send to residential hospice at this time as they would like to give a chance to see if she participates in therapy and becomes mobile again.MOST form was completed and placed in the chart.  Code Status: DNR* Family Communication: Goals of care meeting done with the family Disposition Plan: SNF    Consultants:  None  Procedures:  *None  Antibiotics:  *IV cefazolin for two doses  HPI/Subjective: Patient seen and examined, became agitated this morning and removed her dressing. At this time she is very quite lying in the bed with eyes closed. Patient also had temp of 101 last night and is afebrile at this time.  Objective: Filed Vitals:   05/30/13 1025  BP:   Pulse:   Temp: 98.9 F (37.2 C)  Resp:     Intake/Output Summary (Last 24 hours) at 05/30/13 1358 Last data filed at 05/30/13 0900  Gross per 24 hour  Intake    720 ml  Output    300 ml  Net    420 ml   Filed Weights   05/28/13 1930  Weight: 54.432 kg (120 lb)    Exam:   General:  appears in no acute distress, patient is pleasantly confused  Cardiovascular: S1-S2 regular  Respiratory: clear to auscultation bilaterally  Abdomen: *soft nontender, no organomegaly  Musculoskeletal: right thigh in dressing  Data Reviewed: Basic Metabolic Panel:  Recent Labs Lab 05/28/13 2256 05/29/13 0600 05/29/13 1730 05/30/13 0715  NA 147* 143  --  140  K 3.5 3.4*  --  3.2*  CL 107 108  --  106  CO2 29 27  --  28  GLUCOSE 160* 116*  --  94  BUN 12 12  --  10  CREATININE 1.21* 1.09 0.90 0.85  CALCIUM 9.4 8.9  --  8.5   Liver Function Tests: No results found for this basename: AST, ALT, ALKPHOS, BILITOT, PROT, ALBUMIN,  in the last 168 hours No results found for this basename: LIPASE, AMYLASE,  in the last 168 hours No results found for this basename: AMMONIA,  in the last 168 hours CBC:  Recent Labs Lab 05/28/13 2256 05/29/13 0600 05/29/13 1730 05/30/13 0715  WBC 9.2 5.9 7.2 6.0  NEUTROABS 7.3  --   --   --   HGB 11.5* 9.3* 9.1* 8.6*  HCT 34.1* 27.7* 27.1* 25.0*  MCV 87.0 87.1 88.0 85.9  PLT 177 PLATELET CLUMPS NOTED ON SMEAR, COUNT APPEARS ADEQUATE 120* 114*   Cardiac Enzymes: No results found for this basename: CKTOTAL, CKMB, CKMBINDEX, TROPONINI,  in the last 168 hours BNP (last 3 results) No results found for this basename: PROBNP,  in the last 8760 hours CBG: No results found for this basename: GLUCAP,  in the last 168 hours  Recent Results (from the past 240 hour(s))  URINE CULTURE     Status: None   Collection Time    Jun 28, 2013  5:20 AM      Result Value Range Status   Specimen Description URINE, CATHETERIZED   Final   Special  Requests NONE   Final   Culture  Setup Time     Final   Value: 28-Jun-2013 08:52     Performed at [facility] Count     Final   Value: NO GROWTH     Performed at [facility]   Culture     Final   Value: NO GROWTH     Performed at [facility]   Report Status 05/30/2013 FINAL   Final  SURGICAL PCR SCREEN     Status: None   Collection Time    28-Jun-2013  8:43 AM      Result Value Range Status   MRSA, PCR NEGATIVE  NEGATIVE Final   Staphylococcus aureus NEGATIVE  NEGATIVE Final   Comment:            The Xpert SA Assay (FDA     approved for NASAL specimens     in patients over 62 years of age),     is one component of     a comprehensive surveillance     program.  Test performance has     been validated by [employer] for patients greater     than or equal to 22 year old.     It is not intended     to diagnose infection nor to     guide or monitor treatment.     Studies: Dg Hip Operative Right  28-Jun-2013   CLINICAL DATA:  Femur fracture  EXAM: DG OPERATIVE RIGHT HIP  TECHNIQUE: A single spot fluoroscopic AP image of the right hip is submitted.  COMPARISON:  05/28/2013  FINDINGS: Multiple intraoperative spot images demonstrate placement of a dynamic compression screw and intra medullary rod with a single distal interlocking screw transfixing an intertrochanteric femur fracture. Anatomic alignment. No breakage or loosening of the hardware.  IMPRESSION: ORIF intertrochanteric right femur fracture.   Electronically Signed   By: Maryclare Bean M.D.   On: 28-Jun-2013 15:40   Dg Pelvis Portable  05/28/2013   CLINICAL DATA:  Fall, right hip pain  EXAM: PORTABLE PELVIS  COMPARISON:  CT abdomen/ pelvis 03/30/2005  FINDINGS: Acute displaced intertrochanteric fracture. The lesser trochanter appears to exist as an independent fracture fragment. The bones are osteopenic. The visualized bony pelvis is intact. Mild lower lumbar degenerative disc disease. Unremarkable  visualized bowel gas pattern.  IMPRESSION: Acute, minimally displaced right intertrochanteric femoral neck fracture.   Electronically Signed   By: Malachy Moan M.D.   On: 05/28/2013 20:26   Dg Chest Port 1 View  05/28/2013   *RADIOLOGY REPORT*  Clinical Data: Status post fall; preoperative chest radiograph.  PORTABLE CHEST - 1 VIEW  Comparison: Chest radiograph performed 02/19/2007  Findings: The lungs are well-aerated and clear.  There is no evidence of focal  opacification, pleural effusion or pneumothorax.  The cardiomediastinal silhouette is borderline normal in size.  No acute osseous abnormalities are seen.  IMPRESSION: No acute cardiopulmonary process seen.  No displaced rib fractures identified.   Original Report Authenticated By: Tonia Ghent, M.D.    Scheduled Meds: . docusate sodium  100 mg Oral BID  . enoxaparin (LOVENOX) injection  30 mg Subcutaneous Q24H  . levothyroxine  88 mcg Oral QAC breakfast   Continuous Infusions: . lactated ringers 50 mL/hr at 05/29/13 1148    Principal Problem:   Hip fracture requiring operative repair Active Problems:   Dementia   Hypothyroidism   Protein-calorie malnutrition, severe    Time spent: *25 min    [facility]  [facility] Pager [phone]*. If 7PM-7AM, please contact night-coverage at [email or web address], password [facility] 05/30/2013, 1:58 PM  LOS: 2 days

## 2013-05-30 NOTE — Progress Notes (Signed)
OT Cancellation Note and Discharge  Patient Details Name: Tonya Caldwell MRN: [id number] DOB: 07-29-1941   Cancelled Treatment:    Reason Eval/Treat Not Completed: OT screened, no needs identified, will sign off. Spoke with husband who is pt's primary caretaker and he reports that PTA pt was total care for all BADLs even though totally ambulatory. No acute OT needs identified due to PLOF. Acute OT will sign off.  Evette Georges [phone] 05/30/2013, 8:50 AM

## 2013-05-30 NOTE — Evaluation (Deleted)
Physical Therapy Evaluation Patient Details Name: Tonya Caldwell MRN: [id number] DOB: 1941-01-22 Today's Date: 05/30/2013 Time: 4098-1191 PT Time Calculation (min): 37 min  PT Assessment / Plan / Recommendation History of Present Illness  Pt. was being cared for in the home by husband.  She slipped and fell , sustaining a R IT hip fx. and is now s/p ORIF.  Pt. has decreased mobility and gait.  She is under hospice care and has late stage dementia.  Husband reports she is combative quite often.  Discussed PT plan with husband and gave him the option of [place] attempting PT vs. no PT.  He elects to trial PT to see if pt. can tolerate.  He states he wants to "see if it helps her".   Clinical Impression  Pt. Presents to PT with decreases in functional mobility and gait and has history of being an independent ambulator.  She needs a trial of PT to address mobility and additional issues.    PT Assessment  Patient needs continued PT services    Follow Up Recommendations  SNF;Supervision/Assistance - 24 hour;Supervision for mobility/OOB    Does the patient have the potential to tolerate intense rehabilitation      Barriers to Discharge Decreased caregiver support (pt. not able to handle her at this level)      Equipment Recommendations  None recommended by PT    Recommendations for Other Services     Frequency Min 3X/week    Precautions / Restrictions Precautions Precautions: Posterior Hip;Other (comment) ("if hemiarthroplasty") Precaution Booklet Issued: No Restrictions Weight Bearing Restrictions: Yes RLE Weight Bearing: Weight bearing as tolerated   Pertinent Vitals/Pain See vitals tab       Mobility  Bed Mobility Bed Mobility: Supine to Sit;Sitting - Scoot to Edge of Bed;Sit to Supine Supine to Sit: 1: +2 Total assist;HOB flat Supine to Sit: Patient Percentage: 0% Sitting - Scoot to Edge of Bed: 1: +2 Total assist Sitting - Scoot to Edge of Bed: Patient Percentage: 0% Sit to  Supine: 1: +2 Total assist Sit to Supine: Patient Percentage: 0% Details for Bed Mobility Assistance: Pt. required 2 assist with use of bed pad to facilitate mobility.  Pt. not able to proveide any assist in the process. Transfers Transfers: Sit to Stand;Stand to Sit Sit to Stand: 1: +2 Total assist;From bed;With upper extremity assist Sit to Stand: Patient Percentage: 30% Details for Transfer Assistance: Pt. made several attempts to stand while sitting at edge of bed and could begin to clear hips but unable to  progress further due to increased agitation.  Pt. assisted to sitting back on edge of bed then back to supine position. Ambulation/Gait Ambulation/Gait Assistance: Not tested (comment)    Exercises     PT Diagnosis: Difficulty walking;Abnormality of gait;Acute pain  PT Problem List: Decreased activity tolerance;Decreased balance;Decreased mobility;Decreased knowledge of use of DME;Decreased safety awareness;Decreased knowledge of precautions;Pain;Decreased cognition PT Treatment Interventions: DME instruction;Gait training;Functional mobility training;Therapeutic activities;Therapeutic exercise;Balance training;Patient/family education     PT Goals(Current goals can be found in the care plan section) Acute Rehab PT Goals Patient Stated Goal: pt. could not provide goal; husband wants to proceed with trial of PT to pt's tolerance and wants her to transfer to [facility] upon DC from [place]. PT Goal Formulation: With family Time For Goal Achievement: 06/06/13 Potential to Achieve Goals: Fair  Visit Information  Last PT Received On: 05/30/13 Assistance Needed: +2 History of Present Illness: Pt. was being cared for in the home  by husband.  She slipped and fell , sustaining a R IT hip fx. and is now s/p ORIF.  Pt. has decreased mobility and gait.  She is under hospice care and has lae stage dementia.  Husband reports she is combative quite often.  Discussed PT plan with husband  and gave him the option of [place] attempting PT vs. no PT.  He elects to trial PT to see if pt. can tolerate.  He states he wants to "see if it helps her".        Prior Functioning  Home Living Family/patient expects to be discharged to:: Hospice/Palliative care Prior Function Level of Independence: Needs assistance Communication Communication: Other (comment) (typical for  late stage dementia)    Cognition  Cognition Arousal/Alertness: Lethargic Behavior During Therapy: Agitated Overall Cognitive Status: History of cognitive impairments - at baseline Memory: Decreased recall of precautions;Decreased short-term memory    Extremity/Trunk Assessment Upper Extremity Assessment Upper Extremity Assessment: Overall WFL for tasks assessed Lower Extremity Assessment Lower Extremity Assessment: RLE deficits/detail RLE: Unable to fully assess due to pain (and due to cognitive status)   Balance Balance Balance Assessed: Yes Static Sitting Balance Static Sitting - Balance Support: Bilateral upper extremity supported;Feet supported Static Sitting - Level of Assistance: 5: Stand by assistance Static Sitting - Comment/# of Minutes: 7  End of Session PT - End of Session Activity Tolerance: Treatment limited secondary to agitation;Patient limited by pain Patient left: in bed;with call bell/phone within reach;with nursing/sitter in room;with family/visitor present;with bed alarm set Nurse Communication: Mobility status;Precautions;Weight bearing status  GP     Ferman Hamming 05/30/2013, 11:05 AM Weldon Picking PT Acute Rehab Services [phone] [facility] [phone]

## 2013-05-30 NOTE — Progress Notes (Addendum)
Clinical Social Work Department  CLINICAL SOCIAL WORK PLACEMENT NOTE   Patient:Tonya Caldwell  Account Number: [id number]  Admit date: 05/28/13 Clinical Social Worker: Sabino Niemann LCSWA Date/time: 05/30/13 11:30 AM  Clinical Social Work is seeking post-discharge placement for this patient at the following level of care: SKILLED NURSING (*CSW will update this form in Epic as items are completed)  05/30/13  Patient/family provided with [facility] Department of Clinical Social Work's list of facilities offering this level of care within the geographic area requested by the patient (or if unable, by the patient's family).  05/30/13  Patient/family informed of their freedom to choose among providers that offer the needed level of care, that participate in Medicare, Medicaid or managed care program needed by the patient, have an available bed and are willing to accept the patient.  05/30/13  Patient/family informed of [facility]' ownership interest in [facility], as well as of the fact that they are under no obligation to receive care at this facility.  PASARR submitted to EDS on 05/30/13  PASARR number received from EDS on 05/30/13  FL2 transmitted to all facilities in geographic area requested by pt/family on 05/30/13  FL2 transmitted to all facilities within larger geographic area on  Patient informed that his/her managed care company has contracts with or will negotiate with certain facilities, including the following:  Patient/family informed of bed offers received: 05/31/13 Patient chooses bed at [facility] Physician recommends and patient chooses bed at  Patient to be transferred to on 06/03/2013 Patient to be transferred to facility by [facility] The following physician request were entered in Epic:  Additional Comments:   Sabino Niemann, MSW, LCSWA [phone]

## 2013-05-30 NOTE — Progress Notes (Signed)
Orthopedic Tech Progress Note Patient Details:  Tonya Caldwell [birth year] [id number]  Patient ID: Tonya Caldwell, female   DOB: Jun 04, 1941, 72 y.o.   MRN: [id number]   Tonya Caldwell 05/30/2013, 4:30 PMTrapeze bar

## 2013-05-30 NOTE — Op Note (Signed)
Tonya Caldwell, Tonya NO.:  [id number]  MEDICAL RECORD NO.:  [id number]  LOCATION:  5N22C                        FACILITY:  [facility]  PHYSICIAN:  Vania Rea. Dolph Tavano, M.D.  DATE OF BIRTH:  08-08-1941  DATE OF PROCEDURE:  05/29/2013 DATE OF DISCHARGE:                              OPERATIVE REPORT   PREOPERATIVE DIAGNOSIS:  Displaced right intertrochanteric hip fracture.  POSTOPERATIVE DIAGNOSIS:  Displaced right intertrochanteric hip fracture.  PROCEDURE:  Open reduction and internal fixation of a right intertrochanteric hip.  SURGEON:  Francena Hanly, M.D.  ASSISTANT:  French Ana A. Shuford, PA-C  ANESTHESIA:  General endotracheal.  ESTIMATED BLOOD LOSS:  200 mL.  DRAINS:  None.  HISTORY:  Tonya Caldwell is a 72 year old female with profound Alzheimer's dementia on in home hospice care, DNR status, who fell at home yesterday sustaining a displaced right intertrochanteric hip fracture.  She is brought to the emergency room where I had conversation with Tonya Caldwell's family, caregivers, and powers of attorney, regarding treatment options as well as risks versus benefits thereof.  We did discuss at length the potential benefits of surgical stabilization to include improved pain control, bed mobility, and reduce risk of complication related to prolonged bed immobility.  Possible surgical complications were reviewed including potential for bleeding, infection, neurovascular injury, malunion, nonunion, loss of fixation, possible need for additional surgery.  Given the complexity of situation and goals to achieve comfort care, all parties in agreement with that planned cervical surgical stabilization provide most expedient option for achieving these goals.  PROCEDURE IN DETAIL:  After undergoing routine preop evaluation, the patient did receive prophylactic antibiotics.  She was brought to the operating room, and on her hospital bed underwent smooth induction of general  endotracheal anesthesia.  Transferred to the fracture table in supine position, where she was appropriately padded and protected.  The left leg was placed in Well-leg holder.  Right leg was placed in gentle longitudinal traction.  We did obtain initial fluoroscopic images and performed some reduction maneuvers to ensure proper alignment of the fracture site.  The right hip girdle region was then sterilely prepped and draped in standard fashion.  Time-out was called.  A 3 cm incision was made just proximal to the greater trochanter.  Skin and subcu and deep fascia divided sharply.  A starting awl was introduced into the apex of the greater trochanter, and this was introduced into the proximal femur with proper positioning confirmed on fluoroscopic imaging.  A ball-tip guidewire was then directed down the femoral shaft. I used a starting reamer to open up the proximal femur and then over the guidewire, we passed the 11 mm standard length to fix this intramedullary hip screw.  This was seated to the appropriate depth and guidewires were removed.  Off the out rigger guide and then passed a guidepin up into the femoral neck and head with proper positioning and confirmed fluoroscopically.  This was then drilled and a 95 mm lag screw was directed over the guidewire after the femoral neck and head with proper positioning confirmed fluoroscopically.  At this point, we then placed a single distal static locking screw.  Proper positioning confirmed fluoroscopically.  The final alignment and configuration was confirmed with good position of the hardware and good alignment the fracture site.  Wounds were then irrigated, closed with 2-0 Vicryl for the deep and superficial layers proximally, intracuticular 3-0 Monocryl over the skin, I felt various incisions and dry dressings were applied.  Ralene Bathe, PA-C was used as an Geophysicist/field seismologist essential for help with positioning the extremity and management of  the equipment, retraction and manipulation of the extremity, wound closure and intraoperative decision making.  At the completion of the case, the patient was awakened, extubated, and taken to the recovery room in stable condition.     Vania Rea. Mi Balla, M.D.     KMS/MEDQ  D:  05/29/2013  T:  05/30/2013  Job:  [id number]

## 2013-05-30 NOTE — Progress Notes (Signed)
Tonya Caldwell  MRN: [id number] DOB/Age: 1941-03-26 72 y.o. Physician: Jacquelyne Balint Procedure: Procedure(s) (LRB): INTRAMEDULLARY (IM) NAIL INTERTROCHANTRIC (Right)     Subjective: Poor intake, husband states she will not eat or drink anything. Pulled her dressings off her hip and thigh earlier, nursing redressed  Vital Signs Temp:  [97.3 F (36.3 C)-101.6 F (38.7 C)] 98.9 F (37.2 C) (10/09 1025) Pulse Rate:  [61-96] 81 (10/09 0555) Resp:  [16-17] 16 (10/09 0555) BP: (115-152)/(48-69) 129/52 mmHg (10/09 0555) SpO2:  [93 %-100 %] 94 % (10/09 0555)  Lab Results  Recent Labs  05/29/13 1730 05/30/13 0715  WBC 7.2 6.0  HGB 9.1* 8.6*  HCT 27.1* 25.0*  PLT 120* 114*   BMET  Recent Labs  05/29/13 0600 05/29/13 1730 05/30/13 0715  NA 143  --  140  K 3.4*  --  3.2*  CL 108  --  106  CO2 27  --  28  GLUCOSE 116*  --  94  BUN 12  --  10  CREATININE 1.09 0.90 0.85  CALCIUM 8.9  --  8.5   INR  Date Value Range Status  05/28/2013 1.00  0.00 - 1.49 Final     Exam Right thigh dressings in place without active bleeding        Plan Cont current treatment plan per medical team Hip stable orthopedically  Tevin Shillingford for Dr.Kevin Supple 05/30/2013, 1:07 PM

## 2013-05-30 NOTE — Progress Notes (Signed)
NUTRITION FOLLOW UP  Pt meets criteria for SEVERE MALNUTRITION in the context of chronic illness as evidenced by 40% weight loss x 1 year and intake </= 75% of her needs in >/= 1 month.  Intervention:   Ensure Complete po BID, each supplement provides 350 kcal and 13 grams of protein.  Nutrition Dx:   Increased nutrient needs related to surgery as evidenced by estimated needs; ongoing.    Goal:  Pt to meet >/= 90% of their estimated nutrition needs; not met.   Monitor:  Diet advancement, PO intake, weight trend   Assessment:   Pt admitted for surgical repair of a hip fracture after a fall. Pt is a hospice patient and has a hx of dementia.  Son, who works at [employer] in-patient rehab, provided hx. Per son pt has been losing weight over the last year, since her last birthday, approximately 80 lb, or 40% of her weight.  Per son pt sometimes eats and sometimes she does not. At the most she eats a bowl of cereal at Breakfast, Lunch is a bowl of chili from [facility].  Per son pt will either d/c to a hospice house or SNF for short term rehab. Pt already has hospice in the home.   Height: Ht Readings from Last 1 Encounters:  05/28/13 5\' 1"  (1.549 m)    Weight Status:   Wt Readings from Last 1 Encounters:  05/28/13 120 lb (54.432 kg)  Usual weight 200 lb 1 year ago  Re-estimated needs:  Kcal: 1400-1500  Protein: 65-75 grams  Fluid: > 1.5L/day  Skin: incision right hip, left leg abrasion  Diet Order: General Meal Completion: 10-30%   Intake/Output Summary (Last 24 hours) at 05/30/13 1156 Last data filed at 05/30/13 0900  Gross per 24 hour  Intake   1820 ml  Output    725 ml  Net   1095 ml    Last BM: PTA   Labs:   Recent Labs Lab 05/28/13 2256 05/29/13 0600 05/29/13 1730 05/30/13 0715  NA 147* 143  --  140  K 3.5 3.4*  --  3.2*  CL 107 108  --  106  CO2 29 27  --  28  BUN 12 12  --  10  CREATININE 1.21* 1.09 0.90 0.85  CALCIUM 9.4 8.9  --  8.5  GLUCOSE 160* 116*   --  94    CBG (last 3)  No results found for this basename: GLUCAP,  in the last 72 hours  Scheduled Meds: . docusate sodium  100 mg Oral BID  . enoxaparin (LOVENOX) injection  30 mg Subcutaneous Q24H  . levothyroxine  88 mcg Oral QAC breakfast    Continuous Infusions: . lactated ringers 50 mL/hr at 05/29/13 [address], LDN, CNSC [phone] Pager [phone] After Hours Pager

## 2013-05-31 ENCOUNTER — Encounter (HOSPITAL_COMMUNITY): Payer: Self-pay | Admitting: Emergency Medicine

## 2013-05-31 LAB — BASIC METABOLIC PANEL
BUN: 13 mg/dL (ref 6–23)
CO2: 29 mEq/L (ref 19–32)
Calcium: 8.8 mg/dL (ref 8.4–10.5)
Glucose, Bld: 177 mg/dL — ABNORMAL HIGH (ref 70–99)
Potassium: 3.6 mEq/L (ref 3.5–5.1)
Sodium: 141 mEq/L (ref 135–145)

## 2013-05-31 LAB — CBC
Hemoglobin: 8.6 g/dL — ABNORMAL LOW (ref 12.0–15.0)
MCH: 29.6 pg (ref 26.0–34.0)
RBC: 2.91 MIL/uL — ABNORMAL LOW (ref 3.87–5.11)

## 2013-05-31 MED ORDER — MIRTAZAPINE 15 MG PO TBDP
7.5000 mg | ORAL_TABLET | Freq: Every day | ORAL | Status: DC
  Administered 2013-05-31: 22:00:00 via ORAL
  Administered 2013-06-01 – 2013-06-02 (×2): 7.5 mg via ORAL
  Filled 2013-05-31 (×6): qty 0.5

## 2013-05-31 NOTE — Progress Notes (Signed)
Tonya Caldwell  MRN: [id number] DOB/Age: Jan 14, 1941 72 y.o. Physician: Jacquelyne Balint Procedure: Procedure(s) (LRB): INTRAMEDULLARY (IM) NAIL INTERTROCHANTRIC (Right)     Subjective: Awake and in usual confused state. Husband says she rested a few hours last night but still with poor po intake  Vital Signs Temp:  [98.2 F (36.8 C)-99.1 F (37.3 C)] 98.2 F (36.8 C) (10/10 0633) Pulse Rate:  [70-76] 70 (10/10 0633) Resp:  [16-18] 18 (10/10 0633) BP: (118-125)/(76-88) 125/88 mmHg (10/10 0633) SpO2:  [95 %-99 %] 99 % (10/10 0633)  Lab Results  Recent Labs  05/30/13 0715 05/31/13 0830  WBC 6.0 6.4  HGB 8.6* 8.6*  HCT 25.0* 24.6*  PLT 114* 128*   BMET  Recent Labs  05/29/13 0600 05/29/13 1730 05/30/13 0715  NA 143  --  140  K 3.4*  --  3.2*  CL 108  --  106  CO2 27  --  28  GLUCOSE 116*  --  94  BUN 12  --  10  CREATININE 1.09 0.90 0.85  CALCIUM 8.9  --  8.5   INR  Date Value Range Status  05/28/2013 1.00  0.00 - 1.49 Final     Exam Hip dressing with scant bleeding         Plan Cont present treatment plan per medicine  [facility] for Dr.Kevin Supple 05/31/2013, 9:01 AM

## 2013-05-31 NOTE — Progress Notes (Signed)
[facility] PROGRESS NOTE  Tonya Caldwell LKG:[id number] DOB: Mar 27, 1941 DOA: 05/28/2013 PCP: Ancil Boozer, MD  Assessment/Plan: 1. Right hip fracture status post mechanical fall-patient underwent open reduction and internal fixation, continue Vicodin when necessary for pain.  2. Dementia-patient has baseline remains mildly agitated, both Haldol and Ativan have been tried in the past and Haldol caused the patient to become immobile with patient unable to walk and Ativan caused opposite effect of worsening agitation and confusion. Would avoid both Ativan and Haldol at this time unless patient becomes severely agitated at that time when necessary Haldol can be given.  3. Hypernatremia-sodium is improving, we'll continue with IV hydration, mostly due to dehydration  4. Hypokalemia- Replaced.  5. Hypothyroidism-continue with Synthroid  6. Palliative care discussion-goals of care meeting was done with the patient's son and her husband, discussed the CODE STATUS, artificial nutrition hydration, comfort care approach, disposition options. At this time patient is total comfort care, plan to go to skilled nursing facility for possible rehabilitation and then family is hopeful of taking patient home on home hospice. They do not want to send to residential hospice at this time as they would like to give a chance to see if she participates in therapy and becomes mobile again.MOST form was completed and placed in the chart.  Code Status: DNR* Family Communication: Goals of care meeting done with the family Disposition Plan: SNF    Consultants:  None  Procedures:  *None  Antibiotics:  *IV cefazolin for two doses  HPI/Subjective: Patient seen and examined, resting in bed comfortably, Objective: Filed Vitals:   05/31/13 0633  BP: 125/88  Pulse: 70  Temp: 98.2 F (36.8 C)  Resp: 18   No intake or output data in the 24 hours ending 05/31/13 1724 Filed Weights   05/28/13 1930   Weight: 54.432 kg (120 lb)    Exam:   General:  appears in no acute distress, patient is pleasantly confused  Cardiovascular: S1-S2 regular  Respiratory: clear to auscultation bilaterally  Abdomen: *soft nontender, no organomegaly  Musculoskeletal: right thigh in dressing  Data Reviewed: Basic Metabolic Panel:  Recent Labs Lab 05/28/13 2256 05/29/13 0600 05/29/13 1730 05/30/13 0715 05/31/13 0830  NA 147* 143  --  140 141  K 3.5 3.4*  --  3.2* 3.6  CL 107 108  --  106 105  CO2 29 27  --  28 29  GLUCOSE 160* 116*  --  94 177*  BUN 12 12  --  10 13  CREATININE 1.21* 1.09 0.90 0.85 0.80  CALCIUM 9.4 8.9  --  8.5 8.8   Liver Function Tests: No results found for this basename: AST, ALT, ALKPHOS, BILITOT, PROT, ALBUMIN,  in the last 168 hours No results found for this basename: LIPASE, AMYLASE,  in the last 168 hours No results found for this basename: AMMONIA,  in the last 168 hours CBC:  Recent Labs Lab 05/28/13 2256 05/29/13 0600 05/29/13 1730 05/30/13 0715 05/31/13 0830  WBC 9.2 5.9 7.2 6.0 6.4  NEUTROABS 7.3  --   --   --   --   HGB 11.5* 9.3* 9.1* 8.6* 8.6*  HCT 34.1* 27.7* 27.1* 25.0* 24.6*  MCV 87.0 87.1 88.0 85.9 84.5  PLT 177 PLATELET CLUMPS NOTED ON SMEAR, COUNT APPEARS ADEQUATE 120* 114* 128*   Cardiac Enzymes: No results found for this basename: CKTOTAL, CKMB, CKMBINDEX, TROPONINI,  in the last 168 hours BNP (last 3 results) No results found for this basename: PROBNP,  in the last 8760 hours CBG: No results found for this basename: GLUCAP,  in the last 168 hours  Recent Results (from the past 240 hour(s))  URINE CULTURE     Status: None   Collection Time    05/29/13  5:20 AM      Result Value Range Status   Specimen Description URINE, CATHETERIZED   Final   Special Requests NONE   Final   Culture  Setup Time     Final   Value: 05/29/2013 08:52     Performed at [facility] Count     Final   Value: NO GROWTH      Performed at [facility]   Culture     Final   Value: NO GROWTH     Performed at [facility]   Report Status 05/30/2013 FINAL   Final  SURGICAL PCR SCREEN     Status: None   Collection Time    05/29/13  8:43 AM      Result Value Range Status   MRSA, PCR NEGATIVE  NEGATIVE Final   Staphylococcus aureus NEGATIVE  NEGATIVE Final   Comment:            The Xpert SA Assay (FDA     approved for NASAL specimens     in patients over 50 years of age),     is one component of     a comprehensive surveillance     program.  Test performance has     been validated by [employer] for patients greater     than or equal to 59 year old.     It is not intended     to diagnose infection nor to     guide or monitor treatment.     Studies: No results found.  Scheduled Meds: . docusate sodium  100 mg Oral BID  . enoxaparin (LOVENOX) injection  30 mg Subcutaneous Q24H  . levothyroxine  88 mcg Oral QAC breakfast  . mirtazapine  7.5 mg Oral QHS   Continuous Infusions: . lactated ringers 50 mL/hr at 05/30/13 1521    Principal Problem:   Hip fracture requiring operative repair Active Problems:   Dementia   Hypothyroidism   Protein-calorie malnutrition, severe    Time spent: *25 min    [facility]  [facility] Pager [phone]*. If 7PM-7AM, please contact night-coverage at [email or web address], password [facility] 05/31/2013, 5:24 PM  LOS: 3 days

## 2013-05-31 NOTE — Progress Notes (Signed)
Physical Therapy Treatment Patient Details Name: Tonya Caldwell MRN: [id number] DOB: 17-May-1941 Today's Date: 05/31/2013 Time: 1142-1205 PT Time Calculation (min): 23 min  PT Assessment / Plan / Recommendation  History of Present Illness Pt. was being cared for in the home by husband.  She slipped and fell , sustaining a R IT hip fx. and is now s/p ORIF.  Pt. has decreased mobility and gait.  She is under hospice care and has lae stage dementia.  Husband reports she is combative quite often.  Discussed PT plan with husband and gave him the option of [place] attempting PT vs. no PT.  He elects to trial PT to see if pt. can tolerate.  He states he wants to "see if it helps her".    Pt. Tolerating transfers and transitions with increased pain but settles quickly.  Looks good sitting up in recliner chair.  Husband present and wants PT to do as much with pt. As she will tolerate.     Follow Up Recommendations  SNF;Supervision/Assistance - 24 hour;Supervision for mobility/OOB     Does the patient have the potential to tolerate intense rehabilitation     Barriers to Discharge        Equipment Recommendations  None recommended by PT    Recommendations for Other Services    Frequency Min 3X/week   Progress towards PT Goals Progress towards PT goals: Progressing toward goals  Plan Current plan remains appropriate    Precautions / Restrictions Precautions Precautions: Posterior Hip;Other (comment) ("if hemiarthroplasty" per order) Restrictions Weight Bearing Restrictions: Yes RLE Weight Bearing: Weight bearing as tolerated   Pertinent Vitals/Pain See vitals tab     Mobility  Bed Mobility Bed Mobility: Supine to Sit;Sitting - Scoot to Edge of Bed Supine to Sit: 1: +2 Total assist;HOB elevated Supine to Sit: Patient Percentage: 0% Sitting - Scoot to Edge of Bed: 1: +2 Total assist Sitting - Scoot to Edge of Bed: Patient Percentage: 0% Details for Bed Mobility Assistance: With continued  use of bed pad, pt. assisted to EOB with 2 total assist.  Pt. unable to understand/process cues to ilicit her assistance.  Husband present and apprised of each step along the way Transfers Transfers: Sit to Stand;Stand to Sit;Stand Pivot Transfers Sit to Stand: 1: +2 Total assist;From bed;With upper extremity assist Sit to Stand: Patient Percentage: 30% Stand to Sit: 1: +2 Total assist;To chair/3-in-1 Stand to Sit: Patient Percentage: 10% Stand Pivot Transfers: 1: +2 Total assist Stand Pivot Transfers: Patient Percentage: 10% Details for Transfer Assistance: Pt. needed manual assist/facilitation to rise to stand and pivot with 2 assist.  Pt. needed 2 assist to descend to recliner.   Ambulation/Gait Ambulation/Gait Assistance: Not tested (comment)    Exercises     PT Diagnosis:    PT Problem List:   PT Treatment Interventions:     PT Goals (current goals can now be found in the care plan section)    Visit Information  Last PT Received On: 05/31/13 Assistance Needed: +2 History of Present Illness: Pt. was being cared for in the home by husband.  She slipped and fell , sustaining a R IT hip fx. and is now s/p ORIF.  Pt. has decreased mobility and gait.  She is under hospice care and has lae stage dementia.  Husband reports she is combative quite often.  Discussed PT plan with husband and gave him the option of [place] attempting PT vs. no PT.  He elects to trial PT to see  if pt. can tolerate.  He states he wants to "see if it helps her".     Subjective Data  Subjective: Pt. reports he would like for PT to attempt session today in hopes that it "will help her".     Cognition  Cognition Arousal/Alertness: Awake/alert (sleeping but arouses easily to stimuli) Behavior During Therapy: Agitated Overall Cognitive Status: History of cognitive impairments - at baseline Memory: Decreased recall of precautions;Decreased short-term memory    Balance  Static Sitting Balance Static Sitting - Balance  Support: Bilateral upper extremity supported;Feet supported Static Sitting - Level of Assistance: 6: Modified independent (Device/Increase time) Static Sitting - Comment/# of Minutes: 5  End of Session PT - End of Session Equipment Utilized During Treatment: Gait belt Activity Tolerance: Treatment limited secondary to agitation;Patient limited by pain Patient left: in chair;with call bell/phone within reach;with chair alarm set;with family/visitor present (husband present) Nurse Communication: Mobility status;Precautions;Weight bearing status   GP     Ferman Hamming 05/31/2013, 1:51 PM [facility] Picking PT [facility] [phone] [place] [phone]

## 2013-05-31 NOTE — Progress Notes (Signed)
Patient continuing to pull at surgical incision, with some bleeding evident.  Steri strips placed, new dressing applied.  Ralene Bathe, PA aware, no new orders at this time.

## 2013-06-01 LAB — CBC
HCT: 22.3 % — ABNORMAL LOW (ref 36.0–46.0)
Hemoglobin: 7.7 g/dL — ABNORMAL LOW (ref 12.0–15.0)
MCH: 29.3 pg (ref 26.0–34.0)
MCV: 84.8 fL (ref 78.0–100.0)
Platelets: 153 10*3/uL (ref 150–400)
RBC: 2.63 MIL/uL — ABNORMAL LOW (ref 3.87–5.11)
WBC: 5.5 10*3/uL (ref 4.0–10.5)

## 2013-06-01 NOTE — Progress Notes (Signed)
[facility] PROGRESS NOTE  Tonya Caldwell ZOX:[id number] DOB: [birth year] DOA: 05/28/2013 PCP: Ancil Boozer, MD  Assessment/Plan: 1. Right hip fracture status post mechanical fall-patient underwent open reduction and internal fixation, continue Vicodin when necessary for pain.  2. Dementia-patient has baseline remains mildly agitated, both Haldol and Ativan have been tried in the past and Haldol caused the patient to become immobile with patient unable to walk and Ativan caused opposite effect of worsening agitation and confusion. Would avoid both Ativan and Haldol at this time unless patient becomes severely agitated at that time when necessary Haldol can be given.  3. Anemia- hemoglobin is down to 7.7, we'll continue to monitor the H&H and transfuse if hemoglobin less than 7.  4. Hypernatremia-sodium is improving, off IV fluids. Encourage by mouth intake  5. Hypokalemia- Replaced.  6. Hypothyroidism-continue with Synthroid  7. Palliative care discussion-goals of care meeting was done with the patient's son and her husband, discussed the CODE STATUS, artificial nutrition hydration, comfort care approach, disposition options. At this time patient is total comfort care, plan to go to skilled nursing facility for possible rehabilitation and then family is hopeful of taking patient home on home hospice. They do not want to send to residential hospice at this time as they would like to give a chance to see if she participates in therapy and becomes mobile again.MOST form was completed and placed in the chart.  Code Status: DNR* Family Communication: Goals of care meeting done with the family Disposition Plan: Discussed with patient's son in detail, no nursing home is accepting the patient due to patient's dementia with agitation. The family is looking into [facility]. Needs to be seen by hospice physician to determine the eligibility,  as patient has been on hospice  before.   Consultants:  None  Procedures:  *None  Antibiotics:  *IV cefazolin for two doses  HPI/Subjective: Patient seen and examined, resting in bed comfortably. Objective: Filed Vitals:   06/01/13 0519  BP: 114/55  Pulse: 70  Temp: 98.6 F (37 C)  Resp: 18    Intake/Output Summary (Last 24 hours) at 06/01/13 1137 Last data filed at 06/01/13 0732  Gross per 24 hour  Intake 2816.67 ml  Output      0 ml  Net 2816.67 ml   Filed Weights   05/28/13 1930  Weight: 54.432 kg (120 lb)    Exam:   General:  appears in no acute distress, patient is pleasantly confused  Cardiovascular: S1-S2 regular  Respiratory: clear to auscultation bilaterally  Abdomen: *soft nontender, no organomegaly  Musculoskeletal: right thigh in dressing  Data Reviewed: Basic Metabolic Panel:  Recent Labs Lab 05/28/13 2256 05/29/13 0600 05/29/13 1730 05/30/13 0715 05/31/13 0830  NA 147* 143  --  140 141  K 3.5 3.4*  --  3.2* 3.6  CL 107 108  --  106 105  CO2 29 27  --  28 29  GLUCOSE 160* 116*  --  94 177*  BUN 12 12  --  10 13  CREATININE 1.21* 1.09 0.90 0.85 0.80  CALCIUM 9.4 8.9  --  8.5 8.8   Liver Function Tests: No results found for this basename: AST, ALT, ALKPHOS, BILITOT, PROT, ALBUMIN,  in the last 168 hours No results found for this basename: LIPASE, AMYLASE,  in the last 168 hours No results found for this basename: AMMONIA,  in the last 168 hours CBC:  Recent Labs Lab 05/28/13 2256 05/29/13 0600 05/29/13 1730 05/30/13 0715 05/31/13 0830  06/01/13 0430  WBC 9.2 5.9 7.2 6.0 6.4 5.5  NEUTROABS 7.3  --   --   --   --   --   HGB 11.5* 9.3* 9.1* 8.6* 8.6* 7.7*  HCT 34.1* 27.7* 27.1* 25.0* 24.6* 22.3*  MCV 87.0 87.1 88.0 85.9 84.5 84.8  PLT 177 PLATELET CLUMPS NOTED ON SMEAR, COUNT APPEARS ADEQUATE 120* 114* 128* 153   Cardiac Enzymes: No results found for this basename: CKTOTAL, CKMB, CKMBINDEX, TROPONINI,  in the last 168 hours BNP (last 3 results) No  results found for this basename: PROBNP,  in the last 8760 hours CBG: No results found for this basename: GLUCAP,  in the last 168 hours  Recent Results (from the past 240 hour(s))  URINE CULTURE     Status: None   Collection Time    05/29/13  5:20 AM      Result Value Range Status   Specimen Description URINE, CATHETERIZED   Final   Special Requests NONE   Final   Culture  Setup Time     Final   Value: 05/29/2013 08:52     Performed at [facility] Count     Final   Value: NO GROWTH     Performed at [facility]   Culture     Final   Value: NO GROWTH     Performed at [facility]   Report Status 05/30/2013 FINAL   Final  SURGICAL PCR SCREEN     Status: None   Collection Time    05/29/13  8:43 AM      Result Value Range Status   MRSA, PCR NEGATIVE  NEGATIVE Final   Staphylococcus aureus NEGATIVE  NEGATIVE Final   Comment:            The Xpert SA Assay (FDA     approved for NASAL specimens     in patients over 74 years of age),     is one component of     a comprehensive surveillance     program.  Test performance has     been validated by [employer] for patients greater     than or equal to 59 year old.     It is not intended     to diagnose infection nor to     guide or monitor treatment.     Studies: No results found.  Scheduled Meds: . docusate sodium  100 mg Oral BID  . enoxaparin (LOVENOX) injection  30 mg Subcutaneous Q24H  . levothyroxine  88 mcg Oral QAC breakfast  . mirtazapine  7.5 mg Oral QHS   Continuous Infusions: . lactated ringers 50 mL/hr at 06/01/13 0002    Principal Problem:   Hip fracture requiring operative repair Active Problems:   Dementia   Hypothyroidism   Protein-calorie malnutrition, severe    Time spent: *25 min    [facility]  [facility] Pager [phone]*. If 7PM-7AM, please contact night-coverage at [email or web address], password [facility] 06/01/2013, 11:37 AM  LOS: 4 days

## 2013-06-01 NOTE — Progress Notes (Signed)
Patient ID: Tonya Caldwell, female   DOB: [birth year], 72 y.o.   MRN: [id number] Subjective: 3 Days Post-Op Procedure(s) (LRB): INTRAMEDULLARY (IM) NAIL INTERTROCHANTRIC (Right)    Patient severely demented no comments specific to her status.  Her husband of nearly 60 years at bedside reporting no events, notes discomfort with movement  Objective:   VITALS:   Filed Vitals:   06/01/13 0519  BP: 114/55  Pulse: 70  Temp: 98.6 F (37 C)  Resp: 18    Intact pulses distally Incision: scant drainage, dried blood  Dressing removed, steri strips stained but tangled with underlying suture so kept in place - will have nursing apply mepilex to left hip   LABS  Recent Labs  05/30/13 0715 05/31/13 0830 06/01/13 0430  HGB 8.6* 8.6* 7.7*  HCT 25.0* 24.6* 22.3*  WBC 6.0 6.4 5.5  PLT 114* 128* 153     Recent Labs  05/29/13 1730 05/30/13 0715 05/31/13 0830  NA  --  140 141  K  --  3.2* 3.6  BUN  --  10 13  CREATININE 0.90 0.85 0.80  GLUCOSE  --  94 177*    No results found for this basename: LABPT, INR,  in the last 72 hours   Assessment/Plan: 3 Days Post-Op Procedure(s) (LRB): INTRAMEDULLARY (IM) NAIL INTERTROCHANTRIC (Right)   Discharge to SNF when available Would be giant support to husband to get her some rehab prior to her returning home to his care

## 2013-06-02 LAB — CBC
HCT: 22.5 % — ABNORMAL LOW (ref 36.0–46.0)
Hemoglobin: 7.7 g/dL — ABNORMAL LOW (ref 12.0–15.0)
MCHC: 34.2 g/dL (ref 30.0–36.0)
MCV: 85.9 fL (ref 78.0–100.0)
Platelets: 190 10*3/uL (ref 150–400)
RDW: 13.5 % (ref 11.5–15.5)

## 2013-06-02 NOTE — Progress Notes (Signed)
Tonya Caldwell  MRN: [id number] DOB/Age: 74-Mar-1942 74 y.o. Physician: Lynnea Maizes, M.D. 4 Days Post-Op Procedure(s) (LRB): INTRAMEDULLARY (IM) NAIL INTERTROCHANTRIC (Right)  Subjective: Asleep in bed. No family in room at this time. Vital Signs Temp:  [97.9 F (36.6 C)-98.8 F (37.1 C)] 97.9 F (36.6 C) (10/11 2059) Pulse Rate:  [79-91] 79 (10/11 2059) Resp:  [20-50] 20 (10/11 2059) BP: (125-157)/(48-60) 125/60 mmHg (10/11 2059) SpO2:  [64 %-100 %] 100 % (10/11 2059)  Lab Results  Recent Labs  06/01/13 0430 06/02/13 0440  WBC 5.5 4.6  HGB 7.7* 7.7*  HCT 22.3* 22.5*  PLT 153 190   BMET  Recent Labs  05/31/13 0830  NA 141  K 3.6  CL 105  CO2 29  GLUCOSE 177*  BUN 13  CREATININE 0.80  CALCIUM 8.8   INR  Date Value Range Status  05/28/2013 1.00  0.00 - 1.49 Final     Exam  Right thigh with dressings intact over incisions, no bleeding or drainage.  Plan Continue comfort care. Patient in hospice program and recommend contacting them to coordinate ongoing care plan.  Tonya Caldwell M 06/02/2013, 11:53 AM

## 2013-06-02 NOTE — Progress Notes (Signed)
Clinical Child psychotherapist (CSW) spoke with MD and RN who reported that patient's plan to go home with hospice is not a good D/C plan because she will need more care. CSW contacted patient's son Roe Coombs [phone] who reported that he wanted his mother to go to a skilled nursing facility because he did not think his father could care for his mother at home. CSW sent patient's clinicals to [facility]. Weekday CSW sent patient's clinicals to [facility]. [facility] in [place] offered a bed pending insurance authorization. CSW explained to patient that insurance may not pay for a skilled nursing facility if patient is not participating in physical therapy. CSW encouraged son to think about private pay and applying for medicaid as a back up. CSW explained to son how to apply for medicaid. Son reported that his first choice would be the [facility] in [place] if the insurance will pay. Son reported that his father cannot pay privately and they would go home with hospice of [facility] if [facility] did not work out. Son reported that he does not want to consider [facility] residential hospice or [facility] home hospice at this point. CSW sent updated Clinicals to [facility]. CSW will update weekday CSW about following up with [facility] and [facility] and following up with [facility] following the patient home for a back up.   Jetta Lout, LCSWA Weekend CSW [phone]

## 2013-06-02 NOTE — Progress Notes (Signed)
[facility] PROGRESS NOTE  Tonya Caldwell:[id number] DOB: [birth year] DOA: 05/28/2013 PCP: Ancil Boozer, MD  Assessment/Plan: 1. Right hip fracture status post mechanical fall-patient underwent open reduction and internal fixation, continue Vicodin when necessary for pain.  2. Dementia-patient has baseline remains mildly agitated, both Haldol and Ativan have been tried in the past and Haldol caused the patient to become immobile with patient unable to walk and Ativan caused opposite effect of worsening agitation and confusion. Would avoid both Ativan and Haldol at this time unless patient becomes severely agitated at that time when necessary Haldol can be given.  3. Anemia- hemoglobin is down to 7.7, we'll continue to monitor the H&H and transfuse if hemoglobin less than 7.  4. Hypernatremia-sodium is improving, off IV fluids. Encourage by mouth intake  5. Hypokalemia- Replaced.  6. Hypothyroidism-continue with Synthroid  7. Palliative care discussion-goals of care meeting was done with the patient's son and her husband, discussed the CODE STATUS, artificial nutrition hydration, comfort care approach, disposition options. At this time patient is total comfort care, plan to go to skilled nursing facility for possible rehabilitation and then family is hopeful of taking patient home on home hospice. They do not want to send to residential hospice at this time as they would like to give a chance to see if she participates in therapy and becomes mobile again.MOST form was completed and placed in the chart.  Code Status: DNR* Family Communication: Goals of care meeting done with the family Disposition Plan: Discussed with patient's son in detail, no nursing home is accepting the patient due to patient's dementia with agitation. The family is looking into [facility]. Needs to be seen by hospice physician to determine the eligibility,  as patient has been on hospice  before.   Consultants:  None  Procedures:  *None  Antibiotics:  *IV cefazolin for two doses  HPI/Subjective: Patient seen and examined, resting in bed comfortably.Has been started on Remeron. Objective: Filed Vitals:   06/01/13 2059  BP: 125/60  Pulse: 79  Temp: 97.9 F (36.6 C)  Resp: 20    Intake/Output Summary (Last 24 hours) at 06/02/13 1042 Last data filed at 06/02/13 0920  Gross per 24 hour  Intake 1533.33 ml  Output      0 ml  Net 1533.33 ml   Filed Weights   05/28/13 1930  Weight: 54.432 kg (120 lb)    Exam:   General:  appears in no acute distress, patient is pleasantly confused  Cardiovascular: S1-S2 regular  Respiratory: clear to auscultation bilaterally  Abdomen: *soft nontender, no organomegaly  Musculoskeletal: right thigh in dressing  Data Reviewed: Basic Metabolic Panel:  Recent Labs Lab 05/28/13 2256 05/29/13 0600 05/29/13 1730 05/30/13 0715 05/31/13 0830  NA 147* 143  --  140 141  K 3.5 3.4*  --  3.2* 3.6  CL 107 108  --  106 105  CO2 29 27  --  28 29  GLUCOSE 160* 116*  --  94 177*  BUN 12 12  --  10 13  CREATININE 1.21* 1.09 0.90 0.85 0.80  CALCIUM 9.4 8.9  --  8.5 8.8   Liver Function Tests: No results found for this basename: AST, ALT, ALKPHOS, BILITOT, PROT, ALBUMIN,  in the last 168 hours No results found for this basename: LIPASE, AMYLASE,  in the last 168 hours No results found for this basename: AMMONIA,  in the last 168 hours CBC:  Recent Labs Lab 05/28/13 2256  05/29/13 1730 05/30/13  0715 05/31/13 0830 06/01/13 0430 06/02/13 0440  WBC 9.2  < > 7.2 6.0 6.4 5.5 4.6  NEUTROABS 7.3  --   --   --   --   --   --   HGB 11.5*  < > 9.1* 8.6* 8.6* 7.7* 7.7*  HCT 34.1*  < > 27.1* 25.0* 24.6* 22.3* 22.5*  MCV 87.0  < > 88.0 85.9 84.5 84.8 85.9  PLT 177  < > 120* 114* 128* 153 190  < > = values in this interval not displayed. Cardiac Enzymes: No results found for this basename: CKTOTAL, CKMB, CKMBINDEX,  TROPONINI,  in the last 168 hours BNP (last 3 results) No results found for this basename: PROBNP,  in the last 8760 hours CBG: No results found for this basename: GLUCAP,  in the last 168 hours  Recent Results (from the past 240 hour(s))  URINE CULTURE     Status: None   Collection Time    05/29/13  5:20 AM      Result Value Range Status   Specimen Description URINE, CATHETERIZED   Final   Special Requests NONE   Final   Culture  Setup Time     Final   Value: 05/29/2013 08:52     Performed at [facility] Count     Final   Value: NO GROWTH     Performed at [facility]   Culture     Final   Value: NO GROWTH     Performed at [facility]   Report Status 05/30/2013 FINAL   Final  SURGICAL PCR SCREEN     Status: None   Collection Time    05/29/13  8:43 AM      Result Value Range Status   MRSA, PCR NEGATIVE  NEGATIVE Final   Staphylococcus aureus NEGATIVE  NEGATIVE Final   Comment:            The Xpert SA Assay (FDA     approved for NASAL specimens     in patients over 59 years of age),     is one component of     a comprehensive surveillance     program.  Test performance has     been validated by [employer] for patients greater     than or equal to 14 year old.     It is not intended     to diagnose infection nor to     guide or monitor treatment.     Studies: No results found.  Scheduled Meds: . docusate sodium  100 mg Oral BID  . enoxaparin (LOVENOX) injection  30 mg Subcutaneous Q24H  . levothyroxine  88 mcg Oral QAC breakfast  . mirtazapine  7.5 mg Oral QHS   Continuous Infusions: . lactated ringers 50 mL/hr at 06/01/13 0002    Principal Problem:   Hip fracture requiring operative repair Active Problems:   Dementia   Hypothyroidism   Protein-calorie malnutrition, severe    Time spent: *25 min    [facility]  [facility] Pager [phone]*. If 7PM-7AM, please contact night-coverage at [email or web address],  password [facility] 06/02/2013, 10:42 AM  LOS: 5 days

## 2013-06-03 ENCOUNTER — Inpatient Hospital Stay
Admission: RE | Admit: 2013-06-03 | Discharge: 2013-08-19 | Disposition: A | Discharge status: Home / Self Care | Payer: Medicare Other | Source: Ambulatory Visit | Attending: Internal Medicine | Admitting: Internal Medicine

## 2013-06-03 DIAGNOSIS — T17908A Unspecified foreign body in respiratory tract, part unspecified causing other injury, initial encounter: Principal | ICD-10-CM

## 2013-06-03 MED ORDER — DSS 100 MG PO CAPS: 100.0000 mg | ORAL_CAPSULE | Freq: Two times a day (BID) | ORAL | Status: DC

## 2013-06-03 MED ORDER — POLYETHYLENE GLYCOL 3350 17 G PO PACK: 17.0000 g | PACK | Freq: Every day | ORAL | Status: DC | PRN

## 2013-06-03 MED ORDER — MIRTAZAPINE 15 MG PO TBDP: 7.5000 mg | ORAL_TABLET | Freq: Every day | ORAL | Status: DC

## 2013-06-03 NOTE — Progress Notes (Signed)
Illa Level to be D/C'd Skilled nursing facility per MD order. Discussed with the patient and all questions fully answered.    Medication List    STOP taking these medications       aspirin 81 MG tablet  Replaced by:  aspirin EC 325 MG tablet      TAKE these medications       acetaminophen 500 MG tablet  Commonly known as:  TYLENOL  Take 1 tablet (500 mg total) by mouth every 6 (six) hours as needed for pain.     aspirin EC 325 MG tablet  Take 1 tablet (325 mg total) by mouth daily.     DSS 100 MG Caps  Take 100 mg by mouth 2 (two) times daily.     furosemide 20 MG tablet  Commonly known as:  LASIX  Take 20 mg by mouth daily.     levothyroxine 88 MCG tablet  Commonly known as:  SYNTHROID, LEVOTHROID  Take 88 mcg by mouth daily.     LORazepam 1 MG tablet  Commonly known as:  ATIVAN  Take 0.5-1 mg by mouth every 8 (eight) hours as needed for anxiety.     mirtazapine 15 MG disintegrating tablet  Commonly known as:  REMERON SOL-TAB  Take 0.5 tablets (7.5 mg total) by mouth at bedtime.     polyethylene glycol packet  Commonly known as:  MIRALAX / GLYCOLAX  Take 17 g by mouth daily as needed.        VVS,  IV catheter discontinued intact. Site without signs and symptoms of complications. Dressing and pressure applied.  An After Visit Summary was printed and given to the patient.  Patient escorted via stretcher, and D/C to [facility] via ambulance.  Barnet Pall  06/03/2013 4:13 PM

## 2013-06-03 NOTE — Discharge Summary (Signed)
Physician Discharge Summary  Tonya Caldwell RUE:[id number] DOB: [birth year] DOA: 05/28/2013  PCP: Ancil Boozer, MD  Admit date: 05/28/2013 Discharge date: 06/03/2013  Time spent: *50 minutes  Recommendations for Outpatient Follow-up:  1. *Follow up Hospice after discharge from SNF  Discharge Diagnoses:  Principal Problem:   Hip fracture requiring operative repair Active Problems:   Dementia   Hypothyroidism   Protein-calorie malnutrition, severe   Discharge Condition: Stable*  Diet recommendation: Comfort diet  Filed Weights   05/28/13 1930  Weight: 54.432 kg (120 lb)    History of present illness:  72 y.o. female with history of dementia and hypothyroidism had a fall at her house after patient slipped and fell. Patient was brought to the ER and x-rays revealed right hip fracture. Patient is under hospice and is DO NOT RESUSCITATE. At this time surgery has been planned by orthopedic surgeon as a palliative measure. Patient otherwise does not contribute anything to the history due to the dementia. As per patient's husband patient did not hit her head or lose consciousness.   Hospital Course:  1. Right hip fracture status post mechanical fall-patient underwent open reduction and internal fixation, continue Tylenol when necessary for pain. 2. Dementia-patient has baseline remains mildly agitated, both Haldol and Ativan have been tried in the past and Haldol caused the patient to become immobile with patient unable to walk and Ativan caused opposite effect of worsening agitation and confusion. Would avoid both Ativan and Haldol at this time unless patient becomes severely agitated at that time when necessary Haldol can be given. 3. Anemia- hemoglobin has been stable at 7.7 4. Hypernatremia- Resolved, sodium improved to 141 off IV fluids. Encourage by mouth intake 5. Hypokalemia- Replaced. 6. Hypothyroidism-continue with Synthroid 7. Palliative care discussion-goals of care meeting  was done with the patient's son and her husband, discussed the CODE STATUS, artificial nutrition hydration, comfort care approach, disposition options. At this time patient is total comfort care, plan to go to skilled nursing facility for possible rehabilitation and then family is hopeful of taking patient home on home hospice. They do not want to send to residential hospice at this time as they would like to give a chance to see if she participates in therapy and becomes mobile again.MOST form was completed and placed in the chart.     Procedures: INTRAMEDULLARY (IM) NAIL INTERTROCHANTRIC (Right   Consultations: Orthopedics Discharge Exam: Filed Vitals:   06/03/13 0644  BP:   Pulse: 84  Temp: 98.7 F (37.1 C)  Resp: 18    General: Appear in no acute distress Cardiovascular: S1s2 RRR Respiratory: Clear bilaterally Ext : No edema  Discharge Instructions  Discharge Orders   Future Orders Complete By Expires   Diet - low sodium heart healthy  As directed    Increase activity slowly  As directed    Weight bearing as tolerated  As directed    Questions:     Laterality:     Extremity:         Medication List    STOP taking these medications       aspirin 81 MG tablet  Replaced by:  aspirin EC 325 MG tablet      TAKE these medications       acetaminophen 500 MG tablet  Commonly known as:  TYLENOL  Take 1 tablet (500 mg total) by mouth every 6 (six) hours as needed for pain.     aspirin EC 325 MG tablet  Take 1 tablet (325 mg  total) by mouth daily.     DSS 100 MG Caps  Take 100 mg by mouth 2 (two) times daily.     furosemide 20 MG tablet  Commonly known as:  LASIX  Take 20 mg by mouth daily.     levothyroxine 88 MCG tablet  Commonly known as:  SYNTHROID, LEVOTHROID  Take 88 mcg by mouth daily.     LORazepam 1 MG tablet  Commonly known as:  ATIVAN  Take 0.5-1 mg by mouth every 8 (eight) hours as needed for anxiety.     mirtazapine 15 MG disintegrating  tablet  Commonly known as:  REMERON SOL-TAB  Take 0.5 tablets (7.5 mg total) by mouth at bedtime.     polyethylene glycol packet  Commonly known as:  MIRALAX / GLYCOLAX  Take 17 g by mouth daily as needed.       No Known Allergies    The results of significant diagnostics from this hospitalization (including imaging, microbiology, ancillary and laboratory) are listed below for reference.    Significant Diagnostic Studies: Dg Hip Operative Right  June 18, 2013   CLINICAL DATA:  Femur fracture  EXAM: DG OPERATIVE RIGHT HIP  TECHNIQUE: A single spot fluoroscopic AP image of the right hip is submitted.  COMPARISON:  05/28/2013  FINDINGS: Multiple intraoperative spot images demonstrate placement of a dynamic compression screw and intra medullary rod with a single distal interlocking screw transfixing an intertrochanteric femur fracture. Anatomic alignment. No breakage or loosening of the hardware.  IMPRESSION: ORIF intertrochanteric right femur fracture.   Electronically Signed   By: Maryclare Bean M.D.   On: Jun 18, 2013 15:40   Dg Pelvis Portable  05/28/2013   CLINICAL DATA:  Fall, right hip pain  EXAM: PORTABLE PELVIS  COMPARISON:  CT abdomen/ pelvis 03/30/2005  FINDINGS: Acute displaced intertrochanteric fracture. The lesser trochanter appears to exist as an independent fracture fragment. The bones are osteopenic. The visualized bony pelvis is intact. Mild lower lumbar degenerative disc disease. Unremarkable visualized bowel gas pattern.  IMPRESSION: Acute, minimally displaced right intertrochanteric femoral neck fracture.   Electronically Signed   By: Malachy Moan M.D.   On: 05/28/2013 20:26   Dg Chest Port 1 View  05/28/2013   *RADIOLOGY REPORT*  Clinical Data: Status post fall; preoperative chest radiograph.  PORTABLE CHEST - 1 VIEW  Comparison: Chest radiograph performed 02/19/2007  Findings: The lungs are well-aerated and clear.  There is no evidence of focal opacification, pleural effusion or  pneumothorax.  The cardiomediastinal silhouette is borderline normal in size.  No acute osseous abnormalities are seen.  IMPRESSION: No acute cardiopulmonary process seen.  No displaced rib fractures identified.   Original Report Authenticated By: Tonia Ghent, M.D.    Microbiology: Recent Results (from the past 240 hour(s))  URINE CULTURE     Status: None   Collection Time    18-Jun-2013  5:20 AM      Result Value Range Status   Specimen Description URINE, CATHETERIZED   Final   Special Requests NONE   Final   Culture  Setup Time     Final   Value: June 18, 2013 08:52     Performed at [facility]   Colony Count     Final   Value: NO GROWTH     Performed at [facility]   Culture     Final   Value: NO GROWTH     Performed at [facility]   Report Status 05/30/2013 FINAL   Final  SURGICAL PCR SCREEN     Status: None   Collection Time    05/29/13  8:43 AM      Result Value Range Status   MRSA, PCR NEGATIVE  NEGATIVE Final   Staphylococcus aureus NEGATIVE  NEGATIVE Final   Comment:            The Xpert SA Assay (FDA     approved for NASAL specimens     in patients over 29 years of age),     is one component of     a comprehensive surveillance     program.  Test performance has     been validated by [employer] for patients greater     than or equal to 50 year old.     It is not intended     to diagnose infection nor to     guide or monitor treatment.     Labs: Basic Metabolic Panel:  Recent Labs Lab 05/28/13 2256 05/29/13 0600 05/29/13 1730 05/30/13 0715 05/31/13 0830  NA 147* 143  --  140 141  K 3.5 3.4*  --  3.2* 3.6  CL 107 108  --  106 105  CO2 29 27  --  28 29  GLUCOSE 160* 116*  --  94 177*  BUN 12 12  --  10 13  CREATININE 1.21* 1.09 0.90 0.85 0.80  CALCIUM 9.4 8.9  --  8.5 8.8   Liver Function Tests: No results found for this basename: AST, ALT, ALKPHOS, BILITOT, PROT, ALBUMIN,  in the last 168 hours No results found for this  basename: LIPASE, AMYLASE,  in the last 168 hours No results found for this basename: AMMONIA,  in the last 168 hours CBC:  Recent Labs Lab 05/28/13 2256  05/29/13 1730 05/30/13 0715 05/31/13 0830 06/01/13 0430 06/02/13 0440  WBC 9.2  < > 7.2 6.0 6.4 5.5 4.6  NEUTROABS 7.3  --   --   --   --   --   --   HGB 11.5*  < > 9.1* 8.6* 8.6* 7.7* 7.7*  HCT 34.1*  < > 27.1* 25.0* 24.6* 22.3* 22.5*  MCV 87.0  < > 88.0 85.9 84.5 84.8 85.9  PLT 177  < > 120* 114* 128* 153 190  < > = values in this interval not displayed. Cardiac Enzymes: No results found for this basename: CKTOTAL, CKMB, CKMBINDEX, TROPONINI,  in the last 168 hours BNP: BNP (last 3 results) No results found for this basename: PROBNP,  in the last 8760 hours CBG: No results found for this basename: GLUCAP,  in the last 168 hours     Signed:  Jakel Alphin S  [facility] 06/03/2013, 11:47 AM

## 2013-06-03 NOTE — Progress Notes (Signed)
Physical Therapy Treatment Patient Details Name: Tonya Caldwell MRN: [id number] DOB: 07-22-1941 Today's Date: 06/03/2013 Time: 6578-4696 PT Time Calculation (min): 16 min  PT Assessment / Plan / Recommendation  History of Present Illness Pt. was being cared for in the home by husband.  She slipped and fell , sustaining a R IT hip fx. and is now s/p ORIF.  Pt. has decreased mobility and gait.  She is under hospice care and has lae stage dementia.  Husband reports she is combative quite often.  Discussed PT plan with husband and gave him the option of [place] attempting PT vs. no PT.  He elects to trial PT to see if pt. can tolerate.  He states he wants to "see if it helps her".    PT Comments   Pt cont's to yell out due to pain with movement but does settle back down quickly.  Pt does not follow any cues/commands.     Follow Up Recommendations  SNF;Supervision/Assistance - 24 hour;Supervision for mobility/OOB     Does the patient have the potential to tolerate intense rehabilitation     Barriers to Discharge        Equipment Recommendations  None recommended by PT    Recommendations for Other Services    Frequency Min 3X/week   Progress towards PT Goals Progress towards PT goals: Progressing toward goals  Plan Current plan remains appropriate    Precautions / Restrictions Precautions Precautions: Posterior Hip;Other (comment) Restrictions RLE Weight Bearing: Weight bearing as tolerated   Pertinent Vitals/Pain Pt yells out in pain with movement.      Mobility  Bed Mobility Bed Mobility: Supine to Sit;Rolling Right;Rolling Left;Sitting - Scoot to Edge of Bed Rolling Right: 1: +1 Total assist Rolling Left: 1: +2 Total assist Supine to Sit: 1: +2 Total assist Supine to Sit: Patient Percentage: 0% Sitting - Scoot to Edge of Bed: 1: +1 Total assist Sitting - Scoot to Edge of Bed: Patient Percentage: 0% Details for Bed Mobility Assistance: Pt unable to follow any cues due to  dementia.  (A) for all components of transitional movements.   Transfers Transfers: Sit to Stand;Stand to Sit;Stand Pivot Transfers Sit to Stand: 1: +2 Total assist;From bed Sit to Stand: Patient Percentage: 30% Stand to Sit: 1: +2 Total assist;To chair/3-in-1 Stand to Sit: Patient Percentage: 10% Stand Pivot Transfers: 1: +2 Total assist Stand Pivot Transfers: Patient Percentage: 20% Details for Transfer Assistance: Pt attempting to stand before therapist & tech ready.  (A) to achieve complete standing, rotation of hips from bed>recliner, balance, & controlled descent.  Pt unable to take pivotal steps.  Increased agitation with movement but settled quickly once sitting in recliner.       PT Goals (current goals can now be found in the care plan section) Acute Rehab PT Goals PT Goal Formulation: With family Time For Goal Achievement: 06/06/13 Potential to Achieve Goals: Fair  Visit Information  Last PT Received On: 06/03/13 Assistance Needed: +2 History of Present Illness: Pt. was being cared for in the home by husband.  She slipped and fell , sustaining a R IT hip fx. and is now s/p ORIF.  Pt. has decreased mobility and gait.  She is under hospice care and has lae stage dementia.  Husband reports she is combative quite often.  Discussed PT plan with husband and gave him the option of [place] attempting PT vs. no PT.  He elects to trial PT to see if pt. can tolerate.  He  states he wants to "see if it helps her".     Subjective Data      Cognition  Cognition Arousal/Alertness: Awake/alert Behavior During Therapy: Agitated Overall Cognitive Status: History of cognitive impairments - at baseline Memory: Decreased recall of precautions;Decreased short-term memory    Balance     End of Session PT - End of Session Equipment Utilized During Treatment: Gait belt Activity Tolerance: Patient limited by pain Patient left: in chair;with call bell/phone within reach;with chair alarm set;with  family/visitor present Nurse Communication: Mobility status   GP     Lara Mulch 06/03/2013, 3:06 PM   Verdell Face, PTA [phone] 06/03/2013

## 2013-06-03 NOTE — Progress Notes (Signed)
Clinical social worker assisted with patient discharge to skilled nursing facility, [facility].  CSW addressed all family questions and concerns. CSW copied chart and added all important documents. CSW also set up patient transportation with [facility]. Clinical Social Worker will sign off for now as social work intervention is no longer needed.   Brisa Auth, MSW, LCSWA [phone] 

## 2013-06-04 ENCOUNTER — Other Ambulatory Visit: Payer: Self-pay | Admitting: *Deleted

## 2013-06-04 ENCOUNTER — Non-Acute Institutional Stay (SKILLED_NURSING_FACILITY): Payer: Medicare Other | Admitting: Internal Medicine

## 2013-06-04 ENCOUNTER — Ambulatory Visit (HOSPITAL_COMMUNITY): Payer: Medicare Other | Attending: Internal Medicine

## 2013-06-04 DIAGNOSIS — Q649 Congenital malformation of urinary system, unspecified: Secondary | ICD-10-CM

## 2013-06-04 DIAGNOSIS — S72001D Fracture of unspecified part of neck of right femur, subsequent encounter for closed fracture with routine healing: Secondary | ICD-10-CM

## 2013-06-04 DIAGNOSIS — I517 Cardiomegaly: Secondary | ICD-10-CM | POA: Insufficient documentation

## 2013-06-04 DIAGNOSIS — S72009D Fracture of unspecified part of neck of unspecified femur, subsequent encounter for closed fracture with routine healing: Secondary | ICD-10-CM

## 2013-06-04 DIAGNOSIS — F0281 Dementia in other diseases classified elsewhere with behavioral disturbance: Secondary | ICD-10-CM

## 2013-06-04 DIAGNOSIS — E46 Unspecified protein-calorie malnutrition: Secondary | ICD-10-CM

## 2013-06-04 DIAGNOSIS — R509 Fever, unspecified: Secondary | ICD-10-CM

## 2013-06-04 DIAGNOSIS — E039 Hypothyroidism, unspecified: Secondary | ICD-10-CM

## 2013-06-04 MED ORDER — LORAZEPAM 0.5 MG PO TABS: ORAL_TABLET | ORAL | Status: AC

## 2013-06-04 NOTE — Progress Notes (Signed)
Patient ID: Tonya Caldwell, female   DOB: 1941/03/17, 72 y.o.   MRN: [id number]  This is an acute visit.  Level of care skilled.  Facility [facility].  Chief complaint-acute visit status post hospitalization for right hip fracture with repair.  History of present illness.  Patient is a 72 year old female with a history of dementia who fell at her house after she slipped and fell.  X-rays in the ER revealed a right hip fracture.  At home the patient had been under hospice care.  Apparently the operation went without any significant complications.  All her she does have a history of dementia and apparently does have agitation at times.  Apparently aldol and Ativan have both been tried gas however the Haldol caused immobility and Ativan caused worsening agitation and confusion.  Patient apparently did present with some hypernatremia but this resolved with IV fluids.  She also has some low potassium that was supplemented.  In the hospital a palliative care discussion was done with her family-it was decided patient would be total Comfort Care and go to rehabilitation for possible rehabilitation and then family would like to take her home on hospice.  They would prefer however for the time being that she at least try therapy to see if she may become somewhat more mobile  Staff earlier today noted an axillary temperature of 100.1-family thought she felt a little warm earlier today.  Previous medical history.  Right hip fracture status post repair.  Hypertension.  Hyperlipidemia.  Postop anemia.  Hypokalemia apparently resolved.   Past surgical history.  Cholecystectomy.  Abdominal hysterectomy.  Social history.  Patient apparently had been a smoker in the past but does not smoke any longer no alcohol or illicit drug use noted.  She lives at home with her husband who has been very supportive--she also has an involved son who actually is the head of a rehabilitation unit at  [employer]  Family history-his nonpertinent.  Hospital studies.  05/28/2013.  Pelvic x-ray-showed an acute displaced intertrochanteric fracture.  Chest x-ray-no acute process-no rib fractures.  Occasions.  Tylenol 500 mg every 6 hours when necessary.  Aspirin enteric coated 325 mg daily.  Lasix 20 mg daily.  Synthroid 88 mcg daily.  Ativan 0.5 mg every 8 hours as needed again try to avoid this secondary to increased agitation side effect at times.  Remeron 7.5 mg each bedtime.  MiraLax daily as needed.  Review of systems-essentially unobtainable from patient secondary to severe dementia per her family and staff.  Gen. and had agitation last night but apparently this is improved today.  Head ears eyes nose mouth and throat-staff has not noted any complaints of dysphagia nasal discharge or visual issues.  Respiratory-apparently had a cough at times in the hospital after eating but no shortness of breath noted.  Cardiac-no chest pain noted.  GU-apparently  according to familyshe had times appeared to have some reduced urinary output question urinary retention  Muscle skeletal-no joint pain noted.  Psychiatric she does have severe dementia as noted above.  With some history of agitation.  Physical exam.  Temperature is 98.5  currently this is axillary-previously apparently was 100.1 --- pulse 80 respirations 16 blood pressure 106/40.  Gen. this is a frail elderly female in no distress she is not agitated at this time.  Her skin is warm and dry right hip area has 2 small incisions there is some bruising but no sign of infection or drainage.  Eyes pupils. Equal round  react to light sclerae are clear.  Oropharynx clear mucous membranes moist.  Chest is clear to auscultation although poor respiratory effort she does not follow verbal commands.  Heart is regular rate and rhythm without murmur gallop or rub she does not have significant lower extremity edema  she has somewhat reduced pedal pulses bilaterally.  Abdomen is soft non-tender there are positive bowel sounds.  GU-appears she may have some mild suprapubic distention with tenderness-otherwise vaginal area is unremarkable.  Muscle skeletal-moves all her extremities appears x4 although limited exam since she is in bed limited mobility of her right leg secondary to the hip fracture I do not note any deformities upper extremities.  Strength appears to be intact again limited exam upper extremity strength appears intact as well as the left leg.  Neurologic appears grossly intact although she does have severe dementia no lateralizing findings her cranial nerves appear to be intact  Psych she has severe dementia she does not follow verbal commands but she does not appear to be agitated at this time.  Labs.  05/31/2013.  Sodium 141 potassium 3.6 BUN 13 creatinine 0.80.  06/02/2013.  The WBC 4.6 hemoglobin 7.7 platelets 190  Assessment and plan.  #1 right hip fracture with repair-at this point appears stable although rehabilitation may be a problem depending on her agitation-she continues on aspirin for anticoagulation and Tylenol for pain control which actually appears adequate at this time.  #2-question urinary distention-we'll order an in and out catheter notify provider if greater than 300 cc obtained-also will order a UA C&S secondary to some tenderness in suprapubic area.-And apparently low-grade temperature earlier today  #3-question aspiration low-grade temp-we'll check a chest x-ray--and consider speech evaluation  Her 4-dementia-this appears to be quite advanced treating this with  H aldol and Ativan apparently has been problematic although there is a when necessary Ativan ordered from the hospital.  #4-history of anemia-we'll update CBC-- with differential this will also give [place] some insight into the low-grade temperature-also a basic metabolic panel tomorrow morning    #5-low-grade temp-as noted above will obtain a urine as well as a chest x-ray.  #6-hypothyroidism --- she does continue on Synthroid  #7-some history of hypernatremia and hypokalemia--again will update metabolic panel tomorrow I do note she is on Lasix.  #8-protein calorie malnutrition-I suspect this is complicated with her history of dementia-she has been started on Remeron  #9-apparently some history of edema-she is on low-dose Lasix will check BMP  [id number]-of note 40 minutes spent assessing patient-discussing her status with her husband and son at bedside-and coordinating and formulating a plan of care for numerous diagnoses-including review of her hospital recordsl

## 2013-06-05 ENCOUNTER — Non-Acute Institutional Stay (SKILLED_NURSING_FACILITY): Payer: Medicare Other | Admitting: Internal Medicine

## 2013-06-05 DIAGNOSIS — E870 Hyperosmolality and hypernatremia: Secondary | ICD-10-CM

## 2013-06-05 DIAGNOSIS — S72141S Displaced intertrochanteric fracture of right femur, sequela: Secondary | ICD-10-CM

## 2013-06-05 DIAGNOSIS — S72009S Fracture of unspecified part of neck of unspecified femur, sequela: Secondary | ICD-10-CM

## 2013-06-05 DIAGNOSIS — R339 Retention of urine, unspecified: Secondary | ICD-10-CM

## 2013-06-05 DIAGNOSIS — F0281 Dementia in other diseases classified elsewhere with behavioral disturbance: Secondary | ICD-10-CM

## 2013-06-05 DIAGNOSIS — F02818 Dementia in other diseases classified elsewhere, unspecified severity, with other behavioral disturbance: Secondary | ICD-10-CM

## 2013-06-05 NOTE — Progress Notes (Signed)
Patient ID: Tonya Caldwell, female   DOB: Apr 18, 1941, 72 y.o.   MRN: [id number]  Facility; [facility] Chief complaint; admission to SNF post admit to [facility] from October 7 to October 13  History ; this is a frail 72 year old woman with advanced dementia I actually know from a stay at [facility] wound care center earlier this year. She had a venous insufficiency wound at that time. Her care was complicated by need for debridement/pain issues/issues related to agitation associated with her dementia. Nevertheless this eventually closed over. She suffered a fall at home and suffered a right hip fracture. She underwent a palliative pinning which she seems to have tolerated well. Her husband tells me that this patient wanders incessantly at home and has actually had several falls. She is under hospice care due to her advanced dementia. Side the wandering and falls he reports that she has lost over 70 pounds since I cared for her in the early parts of 2014. She is as noted very agitated however both Haldol and Ativan have been tried in the past with Haldol causing her to become immobile and Ativan only having to be agitation. Other issues in hospital included hypernatremia with a maximal sodium level of 147. This was 141 after IV fluid replacement. She was also hypokalemic.  Since her arrival in the facility she was felt to be in urinary retention and she had not voided. A catheterization was done for 300 cc of concentrated-looking urine. He also had a low-grade fever of 100. Urinalysis looked completely clean negative leukocyte esterase and nitrate. A chest x-ray was done that showed no active lung disease. Lab work shows a hemoglobin of 8.2 otherwise normal. Her basic metabolic panel shows a sodium of 141 a potassium of 3.5 her BUN is 16 creatinine 0.86    has a past medical history of Dementia; Hypertension; and Hyperlipemia.VSU left leg  Past Surgical History  Procedure Laterality Date  . Cholecystectomy     . Abdominal hysterectomy    . Hip closed reduction Right 05/29/2013  . Intramedullary (im) nail intertrochanteric Right 05/29/2013    Procedure: INTRAMEDULLARY (IM) NAIL INTERTROCHANTRIC;  Surgeon: Senaida Lange, MD;  Location: [facility] OR;  Service: Orthopedics;  Laterality: Right;   Medications; Tylenol 500 every 6 when necessary, ASA 325 daily, Colace 100 twice a day, Lasix 20 daily, Synthroid 88 mcg daily, Ativan 0.5-1 mg every 8 hours when necessary for anxiety, Remeron SolTab 7.5 each bedtime, MiraLax 17 g when necessary.  Social history; patient is under hospice care at home. Noted for incessant wandering according to the husband. She has lost a considerable amount of weight probably secondary to the wandering and poor oral intake. He states he had her on Ensure at home which I will attempt to continue here.  reports that she has quit smoking. She does not have any smokeless tobacco history on file. She reports that she does not drink alcohol or use illicit drugs.  Review of systems; not possible from the patient due to advanced dementia. However there is considerable weight loss over the course of this year. Marginal oral intake described by the husband. Constant wandering keeping  the patient's husband awake all night.  Physical examination The patient is restless and agitated with attempts at exam. Respiratory; clear entry bilaterally Cardiac heart sounds are normal there is no murmur she does not appear to be grossly dehydrated at the bedside. Abdomen; soft no liver spleen or masses. Extremities there is no evidence of a  DVT. Surgical incision looks fine GU; Foley catheter in place. Musculoskeletal; some old bruising about the right hip incision however the surgical result is clean without it any evidence of infection Mental status; this is compatible with advanced dementia likely to be Alzheimer's disease. No active asleep she does not appear to be overtly delirious. However  rehabilitation in this case will be extremely problematic.  Impression/plan #1 right hip fracture status post ORIF. She appears to have tolerated this remarkably well. Rehabilitation however will be difficult here and I have already discussed the possibility of a nonambulatory result with the husband who is present. #2 urinary retention? I'm not convinced about this this could be a result of some degree of volume contraction. I am going to attempt to remove the Foley catheter tomorrow #3 hypernatremia postoperatively corrected with IV fluids. She was also hypokalemic this may need to be re\re supplemented as her potassium is down to 3.5 #4 advanced dementia with wandering. Once again this is going to be an issue. She was under hospice care at home. The husband appears to be exhausted  We will need to do careful attention to hydration and nutrition. Rehabilitation in a case like this will be difficult in terms of trying to get this lady to a functional ambulatory status. In fact a large percentage of patients like this never ambulate again and I have told the husband this. As some of the no urine output issue from yesterday might have been volume contraction I will attempt to pull the Foley tomorrow without any further pharmacologic treatment.   #5 fever since her arrival in the facility. The cause of this is not completely clear

## 2013-06-08 DIAGNOSIS — R509 Fever, unspecified: Secondary | ICD-10-CM | POA: Insufficient documentation

## 2013-06-08 DIAGNOSIS — F0280 Dementia in other diseases classified elsewhere without behavioral disturbance: Secondary | ICD-10-CM | POA: Insufficient documentation

## 2013-06-08 DIAGNOSIS — Q649 Congenital malformation of urinary system, unspecified: Secondary | ICD-10-CM | POA: Insufficient documentation

## 2013-06-10 ENCOUNTER — Non-Acute Institutional Stay (SKILLED_NURSING_FACILITY): Payer: Medicare Other | Admitting: Internal Medicine

## 2013-06-10 DIAGNOSIS — S72009S Fracture of unspecified part of neck of unspecified femur, sequela: Secondary | ICD-10-CM

## 2013-06-10 DIAGNOSIS — S72141S Displaced intertrochanteric fracture of right femur, sequela: Secondary | ICD-10-CM

## 2013-06-10 DIAGNOSIS — R339 Retention of urine, unspecified: Secondary | ICD-10-CM

## 2013-06-10 DIAGNOSIS — E870 Hyperosmolality and hypernatremia: Secondary | ICD-10-CM

## 2013-06-10 NOTE — Progress Notes (Signed)
Patient ID: Tonya Caldwell, female   DOB: 1941/05/25, 72 y.o.   MRN: [id number] Facility; [facility] Chief complaint; followup issues related to recent admission History; this is use that as a 72 year old woman with advanced dementia and behavioral disturbances occurred she came to [place] after suffering a right hip fracture status post open reduction internal fixation. Her discharge hemoglobin was 7.7. She required some free fluids to correct her sodium and today that is 142. Her hemoglobin is 8.5. BUN 16 creatinine 0.85. She came here with a Foley catheter to do to urinary retention however we are able to move removed this last week of. She appears to be voiding adequately although incontinent.  Review of systems; this is not possible from the patient however in discussion with her husband who is present and staff Skin apparently the pressure areas on her bottom. GU she is voiding adequately. GI some issues with diarrhea  On examination; Gen. she does not appear to be in any distress Cardiac heart sounds are normal she does not appear to be dehydrated. Abdomen very difficult to examine her due to combativeness however there is no tenderness or obvious masses. GU her bladder is not distended suggesting that there is no evidence of retention Rectal she has a moderate amount of soft but formed stool in the rectum suggesting this is not a diarrheal issue.  Impression/plan Hypernatremia; as noted her sodium today is 142 00 monitor this periodically however she does not appear to be dehydrated. Distal fecal impaction with some overflow. I don't believe this is a diarrhea problem Urinary retention this appears to have resolved there is no evidence of this at the bedside today Right hip fracture she is certainly not a candidate for anticoagulation Postoperative anemia her hemoglobin is 8.5 this is up from 8.2 last check and 7.7 in the hospital.  For the moment she appears to be stable. She will need a  enema. She appears to be drinking adequately and voiding adequately.

## 2013-08-18 ENCOUNTER — Non-Acute Institutional Stay (SKILLED_NURSING_FACILITY): Payer: Medicare Other | Admitting: Internal Medicine

## 2013-08-18 DIAGNOSIS — S72001S Fracture of unspecified part of neck of right femur, sequela: Secondary | ICD-10-CM

## 2013-08-18 DIAGNOSIS — S72009S Fracture of unspecified part of neck of unspecified femur, sequela: Secondary | ICD-10-CM

## 2013-08-18 DIAGNOSIS — F0280 Dementia in other diseases classified elsewhere without behavioral disturbance: Secondary | ICD-10-CM

## 2013-08-18 DIAGNOSIS — D649 Anemia, unspecified: Secondary | ICD-10-CM

## 2013-08-18 DIAGNOSIS — E870 Hyperosmolality and hypernatremia: Secondary | ICD-10-CM

## 2013-08-18 DIAGNOSIS — E43 Unspecified severe protein-calorie malnutrition: Secondary | ICD-10-CM

## 2013-08-18 DIAGNOSIS — E039 Hypothyroidism, unspecified: Secondary | ICD-10-CM

## 2013-08-18 NOTE — Progress Notes (Signed)
Patient ID: Tonya Caldwell, female   DOB: 1940-12-17, 72 y.o.   MRN: [id number] This is an acute visit.  Level of care skilled.  Facility [facility].   Chief complaint-discharge note   History of present illness.  Patient is a 72 year old female with a history of dementia who fell at her house after she slipped and fell.  X-rays in the ER revealed a right hip fractur--this was subsequently repaired and she was brought here for rehabilitation-she will be going home will need home health support including a nursing aide.  Previously she had been at home and cared for by her husband but this was  taxing apparently which is understandable.  She did have some postop anemia although this appears to be trending up we will try to recheck this before discharge.  Also had postop hypernatremia which appears to have resolved a.  Medically it appears she's been quite stable nursing staff has not noted any issues other than more dementia with behavior type issues anxiety combativeness at times she does have Xanax when necessary she is also on Depakote 250 mg twice a day.  Originally- she had a low-grade temperature-she was diagnosed with a Pseudomonas UTI and has completed an antibiotic so far she's been afebrile and this appears to be resolved  She also [employer] had issues with constipation but this appears to be stable she is on numerous when necessary medications including MiraLax lactulose and Dulcolax area  Her husband is in the room today as he usually is-he says she seems to be eating better has consistently gained weight per chart review this is encouraging  Her vital signs have been stable although obtaining those at times is difficult secondary to agitation.  .  e.   .  Previous medical history.  Right hip fracture status post repair.  Hypertension.  Hyperlipidemia.  Postop anemia.  Hypokalemia apparently resolved .  Past surgical history.  Cholecystectomy.  Abdominal hysterectomy.    Social history.  Patient apparently had been a smoker in the past but does not smoke any longer no alcohol or illicit drug use noted.  She lives at home with her husband who has been very supportive--she also has an involved son who actually is the head of a rehabilitation unit at [facility]   Family history- nonpertinent.   Hospital studies.  05/28/2013.  Pelvic x-ray-showed an acute displaced intertrochanteric fracture.  Chest x-ray-no acute process-no rib fractures.   Medications.  Depakote 250 mg twice a day.  Tylenol 500 mg every 6 hours when necessary.  Aspirin enteric coated 325 mg daily.  Lasix 20 mg daily.  Synthroid 88 mcg daily.  Xanax 0.5 mg every 8 hours as needed again try to avoid this secondary to increased agitation side effect at times.  Remeron 7.5 mg each bedtime.  MiraLax daily as needed Also has orders for lactulose as well as Dulcolax when necessary .  Review of systems-essentially unobtainable from patient secondary to severe dementia per her family and staff.  Gen. --Continues with agitation at times but apparently is eating better actually has gained it appears at least 5-10 pounds during her stay here  Head ears eyes nose mouth and throat-staff has not noted any complaints of dysphagia nasal discharge or visual issues.  Respiratory-no cough or  shortness of breath noted.  Cardiac-no chest pain noted GI-apparently constipation was an issue earlier in her stay apparently this has resolved she is on numerous agents as noted above.  GU--apparently she is voiding  fairly well she is incontinent apparently there were some concerns of urinary retention earlier in her stay  Muscle skeletal-no joint pain noted.  Psychiatric she does have severe dementia as noted above. With agitation she is seen by psychiatric services who recently increased her Depakote  .  Physical exam.   Temperature 98.0 pulse 76 respirations 20 blood pressure 130/60 this appears to  be stable although did not get a lot of vital signs secondary to agitation.  Gen. this is a frail elderly female in no distress --although she is agitated with exam-.  Her skin is warm and dry --apparently incision site on right thigh has healed unremarkably although could not really assess today secondary to agitation .  Eyes pupils. Equal round react to light sclerae are clear.  Oropharynx--difficult to assess since patient did not really follow verbal commands or open her mouth.  Chest is clear to auscultation although poor respiratory effort she does not follow verbal commands.  Heart is regular rate and rhythm without murmur gallop or rub --appears to have some mild lower extremity edema although according to her husband this is significantly improved from previous level .  Abdomen -could not really assess secondary to agitation.   .  Muscle skeletal-moves all her extremities appears x4 --ambulates in a wheelchair a   Neurologic appears grossly intact although she does have severe dementia no lateralizing findings her cranial nerves appear to be intact  Psych she has severe dementia she does not follow verbal commands and has periods of agitation as noted above   Labs. 06/17/2013.  WBC 6.1 hemoglobin 8.8 platelets 422.  Sodium 143 potassium 4.3 BUN 14 creatinine 0.90  06/10/2013.  WBC 2.92 hemoglobin 8.5 platelets 457.  Sodium 142 potassium 3.8 BUN 16 creatinine 0.85.    05/31/2013.  Sodium 141 potassium 3.6 BUN 13 creatinine 0.80.  06/02/2013.  The WBC 4.6 hemoglobin 7.7 platelets 190    Assessment and plan.  #1 right hip fracture with repair-at this point appears stable her review of therapy notes this was quite a challenge with a history of dementia and family was educated on alternate levels  of care to better assist with Alzheimer's patients---she continues on aspirin for anticoagulation and Tylenol for pain control which actually appears adequate at this time.   #2-history of dementia with agitation-this appears to be relatively at baseline she has been seen by psychiatric services-continues on Depakote routinely as well as Xanax when necessary she does not do well apparently with Ativan or Haldol-her husband is comfortable taking her home will have home health support --a nursing aide-also will have support from his daughte #3-UTI-this apparently has resolved r    .  #4-history of anemia-we'll try to update CBC before discharge however this may be a challenge with her agitation this was discussed with her husband-will write order to try to obtain as patient will allow   .  #5-hypothyroidism --- she does continue on Synthroid--will try to update TSH before discharge  #6-some history of hypernatremia and hypokalemia--again wil  try to l update metabolic panel tomorrow I do note she is on Lasix--obtaining labs as noted above may be a challenge but will write order to give it a try--need follow up by primary care provider.  #7protein calorie malnutrition-I suspect this is complicated with her history of dementia-she has been started on Remeron --her weight is 20 which is encouraging #8-apparently some history of edema-she is on low-dose Lasix will check BMP  if patient will allow  Patient will be going home with home health support nurse and nurse aide-her family is very supportive her husband previously cared for her at home-although it was somewhat of a challenge-I suspect this will continue to be a challenge but family is comfortable with her going home-she will need primary care followup-again will try to obtain updated labs that this may be a challenge -- certainly will need followup by primary care provider as well  [id number] note greater than 30 minutes spent preparing this discharge summary

## 2013-08-20 ENCOUNTER — Telehealth: Payer: Self-pay | Admitting: Family Medicine

## 2013-08-21 NOTE — Telephone Encounter (Signed)
Patient has advanced dementia and recently suffered a hip fracture.  She has been in the nursing home for rehabilitation for the past month. She returned home 2 days ago. She is combative and would be impossible for her husband to bring her in for an appointment. She doesn't cooperate with physical exams or blood draws.  They have been setup with [facility]. She has refills on her medications provided at discharge. I believe this appointment was scheduled at discharge but her husband doesn't feel that she would be able to come in at this time.  He will call back if he feels a visit is necessary or if she needs medication refills.

## 2013-08-23 ENCOUNTER — Ambulatory Visit: Payer: Medicare Other | Admitting: Family Medicine

## 2013-08-26 ENCOUNTER — Telehealth: Payer: Self-pay | Admitting: Family Medicine

## 2013-08-29 ENCOUNTER — Telehealth: Payer: Self-pay | Admitting: *Deleted

## 2013-08-29 MED ORDER — LEVOTHYROXINE SODIUM 88 MCG PO TABS
88.0000 ug | ORAL_TABLET | Freq: Every day | ORAL | Status: DC
Start: 2013-08-29 — End: 2013-09-02

## 2013-08-29 MED ORDER — FUROSEMIDE 20 MG PO TABS: 20.0000 mg | ORAL_TABLET | Freq: Two times a day (BID) | ORAL | Status: DC

## 2013-08-29 MED ORDER — OMEPRAZOLE 20 MG PO CPDR: 20.0000 mg | DELAYED_RELEASE_CAPSULE | Freq: Every day | ORAL | Status: DC

## 2013-08-29 MED ORDER — DIVALPROEX SODIUM 125 MG PO DR TAB: 250.0000 mg | DELAYED_RELEASE_TABLET | Freq: Two times a day (BID) | ORAL | Status: DC

## 2013-08-29 NOTE — Telephone Encounter (Signed)
Refill x 1 Adonis Ryther P. [place]esto CharonWong, M.D.

## 2013-08-29 NOTE — Telephone Encounter (Addendum)
Pt was last seen here in Jan 2013 by you. Chart is back on your desk.She has severe alzheimer's and very combative. She was discharged from nursing home with [facility]. They are in the home and she needs some meds ordered. She needs new rx for levothyroxine 88 mcg 1 a day, prilosec 20 mg 1 a day, lasix 20 mg bid, depakote 125 mg 2 po bid. She uses [facility]

## 2013-09-02 ENCOUNTER — Ambulatory Visit (INDEPENDENT_AMBULATORY_CARE_PROVIDER_SITE_OTHER): Payer: Medicare Other | Admitting: Family Medicine

## 2013-09-02 ENCOUNTER — Telehealth: Payer: Self-pay | Admitting: Family Medicine

## 2013-09-02 VITALS — BP 114/64

## 2013-09-02 DIAGNOSIS — F32A Depression, unspecified: Secondary | ICD-10-CM

## 2013-09-02 DIAGNOSIS — F3289 Other specified depressive episodes: Secondary | ICD-10-CM

## 2013-09-02 DIAGNOSIS — F329 Major depressive disorder, single episode, unspecified: Secondary | ICD-10-CM

## 2013-09-02 DIAGNOSIS — E039 Hypothyroidism, unspecified: Secondary | ICD-10-CM

## 2013-09-02 DIAGNOSIS — F0391 Unspecified dementia with behavioral disturbance: Secondary | ICD-10-CM

## 2013-09-02 DIAGNOSIS — K219 Gastro-esophageal reflux disease without esophagitis: Secondary | ICD-10-CM

## 2013-09-02 DIAGNOSIS — K5900 Constipation, unspecified: Secondary | ICD-10-CM

## 2013-09-02 DIAGNOSIS — R609 Edema, unspecified: Secondary | ICD-10-CM

## 2013-09-02 DIAGNOSIS — F03918 Unspecified dementia, unspecified severity, with other behavioral disturbance: Secondary | ICD-10-CM

## 2013-09-02 MED ORDER — MIRTAZAPINE 15 MG PO TBDP: 7.5000 mg | ORAL_TABLET | Freq: Every day | ORAL | Status: DC

## 2013-09-02 MED ORDER — LEVOTHYROXINE SODIUM 88 MCG PO TABS: 88.0000 ug | ORAL_TABLET | Freq: Every day | ORAL | Status: DC

## 2013-09-02 MED ORDER — POLYETHYLENE GLYCOL 3350 17 G PO PACK: 17.0000 g | PACK | Freq: Every day | ORAL | Status: AC | PRN

## 2013-09-02 MED ORDER — DSS 100 MG PO CAPS: 100.0000 mg | ORAL_CAPSULE | Freq: Two times a day (BID) | ORAL | Status: AC

## 2013-09-02 MED ORDER — FUROSEMIDE 20 MG PO TABS: 20.0000 mg | ORAL_TABLET | Freq: Two times a day (BID) | ORAL | Status: DC

## 2013-09-02 MED ORDER — OMEPRAZOLE 20 MG PO CPDR: 20.0000 mg | DELAYED_RELEASE_CAPSULE | Freq: Every day | ORAL | Status: DC

## 2013-09-02 MED ORDER — DIVALPROEX SODIUM 125 MG PO DR TAB: 250.0000 mg | DELAYED_RELEASE_TABLET | Freq: Two times a day (BID) | ORAL | Status: DC

## 2013-09-02 MED ORDER — ALPRAZOLAM 0.25 MG PO TABS: 0.2500 mg | ORAL_TABLET | Freq: Every evening | ORAL | Status: DC | PRN

## 2013-09-02 NOTE — Patient Instructions (Signed)
Dementia Dementia is a general term for problems with brain function. A person with dementia has memory loss and a hard time with at least one other brain function such as thinking, speaking, or problem solving. Dementia can affect social functioning, how you do your job, your mood, or your personality. The changes may be hidden for a long time. The earliest forms of this disease are usually not detected by family or friends. Dementia can be:  Irreversible.  Potentially reversible.  Partially reversible.  Progressive. This means it can get worse over time. CAUSES  Irreversible dementia causes may include:  Degeneration of brain cells (Alzheimer's disease or lewy body dementia).  Multiple small strokes (vascular dementia).  Infection (chronic meningitis or Creutzfelt-Jakob disease).  Frontotemporal dementia. This affects younger people, age 40 to 70, compared to those who have Alzheimer's disease.  Dementia associated with other disorders like Parkinson's disease, Huntington's disease, or HIV-associated dementia. Potentially or partially reversible dementia causes may include:  Medicines.  Metabolic causes such as excessive alcohol intake, vitamin B12 deficiency, or thyroid disease.  Masses or pressure in the brain such as a tumor, blood clot, or hydrocephalus. SYMPTOMS  Symptoms are often hard to detect. Family members or coworkers may not notice them early in the disease process. Different people with dementia may have different symptoms. Symptoms can include:  A hard time with memory, especially recent memory. Long-term memory may not be impaired.  Asking the same question multiple times or forgetting something someone just said.  A hard time speaking your thoughts or finding certain words.  A hard time solving problems or performing familiar tasks (such as how to use a telephone).  Sudden changes in mood.  Changes in personality, especially increasing moodiness or  mistrust.  Depression.  A hard time understanding complex ideas that were never a problem in the past. DIAGNOSIS  There are no specific tests for dementia.   Your caregiver may recommend a thorough evaluation. This is because some forms of dementia can be reversible. The evaluation will likely include a physical exam and getting a detailed history from you and a family member. The history often gives the best clues and suggestions for a diagnosis.  Memory testing may be done. A detailed brain function evaluation called neuropsychologic testing may be helpful.  Lab tests and brain imaging (such as a CT scan or MRI scan) are sometimes important.  Sometimes observation and re-evaluation over time is very helpful. TREATMENT  Treatment depends on the cause.   If the problem is a vitamin deficiency, it may be helped or cured with supplements.  For dementias such as Alzheimer's disease, medicines are available to stabilize or slow the course of the disease. There are no cures for this type of dementia.  Your caregiver can help direct you to groups, organizations, and other caregivers to help with decisions in the care of you or your loved one. HOME CARE INSTRUCTIONS The care of individuals with dementia is varied and dependent upon the progression of the dementia. The following suggestions are intended for the person living with, or caring for, the person with dementia.  Create a safe environment.  Remove the locks on bathroom doors to prevent the person from accidentally locking himself or herself in.  Use childproof latches on kitchen cabinets and any place where cleaning supplies, chemicals, or alcohol are kept.  Use childproof covers in unused electrical outlets.  Install childproof devices to keep doors and windows secured.  Remove stove knobs or install safety   knobs and an automatic shut-off on the stove.  Lower the temperature on water heaters.  Label medicines and keep them  locked up.  Secure knives, lighters, matches, power tools, and guns, and keep these items out of reach.  Keep the house free from clutter. Remove rugs or anything that might contribute to a fall.  Remove objects that might break and hurt the person.  Make sure lighting is good, both inside and outside.  Install grab rails as needed.  Use a monitoring device to alert you to falls or other needs for help.  Reduce confusion.  Keep familiar objects and people around.  Use night lights or dim lights at night.  Label items or areas.  Use reminders, notes, or directions for daily activities or tasks.  Keep a simple, consistent routine for waking, meals, bathing, dressing, and bedtime.  Create a calm, quiet environment.  Place large clocks and calendars prominently.  Display emergency numbers and home address near all telephones.  Use cues to establish different times of the day. An example is to open curtains to let the natural light in during the day.   Use effective communication.  Choose simple words and short sentences.  Use a gentle, calm tone of voice.  Be careful not to interrupt.  If the person is struggling to find a word or communicate a thought, try to provide the word or thought.  Ask one question at a time. Allow the person ample time to answer questions. Repeat the question again if the person does not respond.  Reduce nighttime restlessness.  Provide a comfortable bed.  Have a consistent nighttime routine.  Ensure a regular walking or physical activity schedule. Involve the person in daily activities as much as possible.  Limit napping during the day.  Limit caffeine.  Attend social events that stimulate rather than overwhelm the senses.  Encourage good nutrition and hydration.  Reduce distractions during meal times and snacks.  Avoid foods that are too hot or too cold.  Monitor chewing and swallowing ability.  Continue with routine vision,  hearing, dental, and medical screenings.  Only give over-the-counter or prescription medicines as directed by the caregiver.  Monitor driving abilities. Do not allow the person to drive when safe driving is no longer possible.  Register with an identification program which could provide location assistance in the event of a missing person situation. SEEK MEDICAL CARE IF:   New behavioral problems start such as moodiness, aggressiveness, or seeing things that are not there (hallucinations).  Any new problem with brain function happens. This includes problems with balance, speech, or falling a lot.  Problems with swallowing develop.  Any symptoms of other illness happen. Small changes or worsening in any aspect of brain function can be a sign that the illness is getting worse. It can also be a sign of another medical illness such as infection. Seeing a caregiver right away is important. SEEK IMMEDIATE MEDICAL CARE IF:   A fever develops.  New or worsened confusion develops.  New or worsened sleepiness develops.  Staying awake becomes hard to do. Document Released: 02/01/2001 Document Revised: 10/31/2011 Document Reviewed: 01/03/2011 [employer] Patient Information 2014 [employer].  

## 2013-09-02 NOTE — Progress Notes (Signed)
   Subjective:    Patient ID: Tonya Caldwell, female    DOB: 1940-09-03, 73 y.o.   MRN: [id number]  HPI This 73 y.o. female presents for evaluation of routine follow up.  She has hx of dementia and She is combative.  She stays in a nursing facility.  She is accompanied by her family.  She  She has home health nursing services when she stays at home with her husband.   Review of Systems No chest pain, SOB, HA, dizziness, vision change, N/V, diarrhea, constipation, dysuria, urinary urgency or frequency, myalgias, arthralgias or rash.     Objective:   Physical Exam  Vital signs noted  Anxious and confused elderly female in wheelchair  HEENT - Head atraumatic Normocephalic Respiratory - Lungs CTA bilateral Cardiac - RRR S1 and S2 without murmur Psych - Confused and agitated and exam limited.      Assessment & Plan:  Depression - Plan: ALPRAZolam (XANAX) 0.25 MG tablet  Unspecified hypothyroidism - Plan: levothyroxine (SYNTHROID, LEVOTHROID) 88 MCG tablet  Edema - Plan: furosemide (LASIX) 20 MG tablet  GERD (gastroesophageal reflux disease) - Plan: omeprazole (PRILOSEC) 20 MG capsule  Dementia with behavioral disturbance - Plan: mirtazapine (REMERON SOL-TAB) 15 MG disintegrating tablet, Docusate Sodium (DSS) 100 MG CAPS, divalproex (DEPAKOTE) 125 MG DR tablet. Discussed with husband that she can get refills of medicine w/o having to bring her to offices since She is so severely demented and anxious.  She can get blood work through home health.  She is too Anxious to have blood drawn.  Unspecified constipation - Plan: polyethylene glycol (MIRALAX / GLYCOLAX) packet  Deatra CanterWilliam J Zen Cedillos FNP

## 2013-09-02 NOTE — Telephone Encounter (Signed)
Pt seen at [facility] today by bill Oxford

## 2013-09-17 ENCOUNTER — Telehealth: Payer: Self-pay | Admitting: Family Medicine

## 2013-09-21 DIAGNOSIS — G309 Alzheimer's disease, unspecified: Secondary | ICD-10-CM

## 2013-09-21 DIAGNOSIS — F0390 Unspecified dementia without behavioral disturbance: Secondary | ICD-10-CM

## 2013-09-21 DIAGNOSIS — E46 Unspecified protein-calorie malnutrition: Secondary | ICD-10-CM

## 2013-09-21 DIAGNOSIS — F0280 Dementia in other diseases classified elsewhere without behavioral disturbance: Secondary | ICD-10-CM

## 2013-09-21 DIAGNOSIS — S72009D Fracture of unspecified part of neck of unspecified femur, subsequent encounter for closed fracture with routine healing: Secondary | ICD-10-CM

## 2013-09-27 ENCOUNTER — Telehealth: Payer: Self-pay | Admitting: Family Medicine

## 2013-09-27 NOTE — Telephone Encounter (Signed)
Switched to liquid, daughter aware

## 2013-10-03 ENCOUNTER — Other Ambulatory Visit: Payer: Self-pay | Admitting: Family Medicine

## 2013-10-25 ENCOUNTER — Other Ambulatory Visit: Payer: Self-pay | Admitting: Family Medicine

## 2013-10-30 ENCOUNTER — Telehealth: Payer: Self-pay | Admitting: Family Medicine

## 2013-10-31 ENCOUNTER — Other Ambulatory Visit: Payer: Self-pay | Admitting: Family Medicine

## 2013-12-30 ENCOUNTER — Telehealth: Payer: Self-pay | Admitting: Family Medicine

## 2013-12-30 NOTE — Telephone Encounter (Signed)
Spoke with pt's daughter regarding medication and [facility] urine They will call back if needed

## 2014-01-06 ENCOUNTER — Telehealth: Payer: Self-pay | Admitting: Family Medicine

## 2014-01-06 NOTE — Telephone Encounter (Signed)
Patient cant walk and get around she is in wheelchair

## 2014-01-06 NOTE — Telephone Encounter (Signed)
Come and do UA and send off cx so we can make sure we are tx the uti

## 2014-01-07 ENCOUNTER — Other Ambulatory Visit: Payer: Self-pay | Admitting: Family Medicine

## 2014-01-07 MED ORDER — NITROFURANTOIN MONOHYD MACRO 100 MG PO CAPS: 100.0000 mg | ORAL_CAPSULE | Freq: Two times a day (BID) | ORAL | Status: AC

## 2014-01-07 NOTE — Telephone Encounter (Signed)
rx macrobid empirically and if sx's persist recommend follow up

## 2014-01-19 DIAGNOSIS — G309 Alzheimer's disease, unspecified: Secondary | ICD-10-CM

## 2014-01-19 DIAGNOSIS — F0390 Unspecified dementia without behavioral disturbance: Secondary | ICD-10-CM

## 2014-01-19 DIAGNOSIS — S72009D Fracture of unspecified part of neck of unspecified femur, subsequent encounter for closed fracture with routine healing: Secondary | ICD-10-CM

## 2014-01-19 DIAGNOSIS — F0280 Dementia in other diseases classified elsewhere without behavioral disturbance: Secondary | ICD-10-CM

## 2014-01-19 DIAGNOSIS — R9389 Abnormal findings on diagnostic imaging of other specified body structures: Secondary | ICD-10-CM

## 2014-01-19 DIAGNOSIS — E46 Unspecified protein-calorie malnutrition: Secondary | ICD-10-CM

## 2014-02-03 ENCOUNTER — Other Ambulatory Visit: Payer: Self-pay | Admitting: Family Medicine

## 2014-02-05 NOTE — Telephone Encounter (Signed)
Last refilled 12/27/13. Last office visit was with Ander SladeBill Oxford, FNP on 09/02/13. Patient is severely demented and lives in a nursing facility according to the last office note.

## 2014-02-10 ENCOUNTER — Other Ambulatory Visit: Payer: Self-pay | Admitting: Family Medicine

## 2014-02-21 ENCOUNTER — Other Ambulatory Visit: Payer: Self-pay | Admitting: Nurse Practitioner

## 2014-02-24 ENCOUNTER — Telehealth: Payer: Self-pay | Admitting: Family Medicine

## 2014-02-24 NOTE — Telephone Encounter (Signed)
Last seen 09/02/13  B OXford

## 2014-02-24 NOTE — Telephone Encounter (Signed)
Alzheimers pt, she is combative and desperately needs med more than once per husband

## 2014-02-24 NOTE — Telephone Encounter (Signed)
Xanax is not good for alzheimers and she may do better with some antipsychotic like riperdal, would like to see her in follow up

## 2014-02-25 ENCOUNTER — Telehealth: Payer: Self-pay | Admitting: Family Medicine

## 2014-02-25 ENCOUNTER — Other Ambulatory Visit: Payer: Self-pay | Admitting: Family Medicine

## 2014-02-25 MED ORDER — RISPERIDONE 0.25 MG PO TABS: 0.2500 mg | ORAL_TABLET | Freq: Every day | ORAL | Status: AC

## 2014-02-25 NOTE — Telephone Encounter (Signed)
Tried to talk to patient daughter Velna HatchetSheila about med's she was wondering if bill could call her daddy and talk about this new med

## 2014-02-25 NOTE — Telephone Encounter (Signed)
Patients daughter aware

## 2014-02-25 NOTE — Telephone Encounter (Signed)
Please have her be seen by Stephanie A[facility]harles Willis and or the [facility] [place].

## 2014-02-25 NOTE — Telephone Encounter (Signed)
They said you told them they never had to come in here because how bad she acted that you would take care of here as much as you could with out them bringing her in here. They said she screamed the hole time she was in here.

## 2014-02-25 NOTE — Telephone Encounter (Signed)
Yes I did tell the patient family that for routine visits we could avoid having the patient come in however for escalation of xanax or for behavioral problems it would be wise to bring the patient in to be seen.  Discuss with the family that the patient may need to be seen by a behavioral specialist or by a psychiatrist.  I have sent low dose risperidone to the pharmacy and would like the patient to take this at hs and see if this helps.  Follow up if not better.

## 2014-02-25 NOTE — Telephone Encounter (Signed)
Spoke with pt's husband to let him know that Dr Christell ConstantMoore recommends trying the Risperdal instead of Xanax Pt's husband verbalizes understanding

## 2014-02-25 NOTE — Telephone Encounter (Signed)
Joyce GrossKay when you find the time can you call the patient family and tell them I have put her on a low dose antipsychotic and that she may need to go see an alzheimer's specialist.  I talked to Dr. Christell ConstantMoore and he recommended a couple of places and they are the previous note below.

## 2014-03-14 ENCOUNTER — Other Ambulatory Visit: Payer: Self-pay | Admitting: Family Medicine

## 2014-03-17 NOTE — Telephone Encounter (Signed)
Not seen since January, severe dementia and violent

## 2014-03-18 ENCOUNTER — Telehealth: Payer: Self-pay | Admitting: Family Medicine

## 2014-03-19 ENCOUNTER — Other Ambulatory Visit: Payer: Self-pay | Admitting: Family Medicine

## 2014-03-19 DIAGNOSIS — F03918 Unspecified dementia, unspecified severity, with other behavioral disturbance: Secondary | ICD-10-CM

## 2014-03-19 DIAGNOSIS — F0391 Unspecified dementia with behavioral disturbance: Secondary | ICD-10-CM

## 2014-03-19 MED ORDER — DIVALPROEX SODIUM 125 MG PO DR TAB: 250.0000 mg | DELAYED_RELEASE_TABLET | Freq: Two times a day (BID) | ORAL | Status: DC

## 2014-03-19 NOTE — Telephone Encounter (Signed)
Please tell her depakote was sent to pharm

## 2014-03-20 NOTE — Telephone Encounter (Signed)
Husband notified  

## 2014-03-22 ENCOUNTER — Other Ambulatory Visit: Payer: Self-pay | Admitting: Family Medicine

## 2014-03-22 MED ORDER — DIVALPROEX SODIUM 125 MG PO CPSP: 125.0000 mg | ORAL_CAPSULE | Freq: Two times a day (BID) | ORAL | Status: DC

## 2014-03-25 ENCOUNTER — Other Ambulatory Visit: Payer: Self-pay | Admitting: *Deleted

## 2014-03-25 MED ORDER — DIVALPROEX SODIUM 125 MG PO CPSP: 250.0000 mg | ORAL_CAPSULE | Freq: Two times a day (BID) | ORAL | Status: AC

## 2014-03-26 NOTE — Telephone Encounter (Signed)
Correct dosage and form called into [facility] per VO Owens & MinorBill Oxford

## 2014-05-24 ENCOUNTER — Other Ambulatory Visit: Payer: Self-pay | Admitting: Family Medicine

## 2014-05-26 NOTE — Telephone Encounter (Signed)
Last filled 03/03/14, last seen 08/2013. Homebound due to dementia. Route to pool A, [facility]

## 2014-06-02 ENCOUNTER — Other Ambulatory Visit: Payer: Self-pay | Admitting: Family Medicine

## 2014-06-02 DIAGNOSIS — F32A Depression, unspecified: Secondary | ICD-10-CM

## 2014-06-02 DIAGNOSIS — F329 Major depressive disorder, single episode, unspecified: Secondary | ICD-10-CM

## 2014-06-02 MED ORDER — ALPRAZOLAM 0.25 MG PO TABS: 0.2500 mg | ORAL_TABLET | Freq: Every evening | ORAL | Status: DC | PRN

## 2014-06-02 NOTE — Telephone Encounter (Signed)
Pt's husband notified that Rx for Xanax is ready for pickup

## 2014-07-01 ENCOUNTER — Telehealth: Payer: Self-pay | Admitting: Family Medicine

## 2014-07-01 NOTE — Telephone Encounter (Signed)
Per pt's daughter pt is running fever of greater than 101.4 and is having abdominal pain She has not had a bowel movement in 4 days Instructed family to take pt to the ED Verbalizes understanding

## 2014-07-11 ENCOUNTER — Other Ambulatory Visit: Payer: Self-pay | Admitting: Nurse Practitioner

## 2014-07-14 ENCOUNTER — Other Ambulatory Visit: Payer: Self-pay | Admitting: Family Medicine

## 2014-07-14 NOTE — Telephone Encounter (Signed)
Last seen 09/02/13, last filled 05/31/14. She saw oxford last, she has combative dementia and doesn't come in

## 2014-07-14 NOTE — Telephone Encounter (Signed)
Called in xanax #30 per [name], FNP.

## 2014-07-14 NOTE — Telephone Encounter (Signed)
Please call in xanax with 0 refills 

## 2014-08-02 ENCOUNTER — Other Ambulatory Visit: Payer: Self-pay | Admitting: Family Medicine

## 2014-08-20 ENCOUNTER — Telehealth: Payer: Self-pay | Admitting: Family Medicine

## 2014-08-20 MED ORDER — FUROSEMIDE 20 MG PO TABS: 20.0000 mg | ORAL_TABLET | Freq: Two times a day (BID) | ORAL | Status: DC

## 2014-08-20 NOTE — Telephone Encounter (Signed)
Done

## 2014-08-29 ENCOUNTER — Telehealth: Payer: Self-pay | Admitting: Family Medicine

## 2014-08-29 ENCOUNTER — Other Ambulatory Visit: Payer: Self-pay | Admitting: Family Medicine

## 2014-08-29 MED ORDER — MIRTAZAPINE 15 MG PO TABS: ORAL_TABLET | ORAL | Status: AC

## 2014-08-29 NOTE — Telephone Encounter (Signed)
Generic Remeron is over $65 per month due to new year Med needs to be changed to something cheaper

## 2014-08-29 NOTE — Telephone Encounter (Signed)
Generic remeron tabs sent to pharm

## 2014-08-29 NOTE — Telephone Encounter (Signed)
RX for plain Remeron sent into [facility] per [facility] MinorBill Oxford Family notified of the price (6.42) [facility]

## 2014-09-05 ENCOUNTER — Other Ambulatory Visit: Payer: Self-pay | Admitting: Family Medicine

## 2014-09-08 NOTE — Telephone Encounter (Signed)
Need levothyroxine refilled. No recent labs.  Last visit was 09/02/2013. She has alzheimer's and the family has difficulty bringing her into the office.  Would you like to approve this refill or does she need to be seen?

## 2014-09-20 ENCOUNTER — Other Ambulatory Visit: Payer: Self-pay | Admitting: Family Medicine

## 2014-09-27 ENCOUNTER — Other Ambulatory Visit: Payer: Self-pay | Admitting: Nurse Practitioner

## 2014-10-02 ENCOUNTER — Telehealth: Payer: Self-pay | Admitting: Family Medicine

## 2014-10-03 ENCOUNTER — Other Ambulatory Visit: Payer: Self-pay | Admitting: Family Medicine

## 2014-10-03 MED ORDER — ALPRAZOLAM 0.25 MG PO TABS: 0.2500 mg | ORAL_TABLET | Freq: Every day | ORAL | Status: AC

## 2014-10-03 NOTE — Telephone Encounter (Signed)
Patients daughter states that they were told they wouldn't have to bring her back to the office to get refills since her dementia was really bad. Please review office note from 09/05/2013.

## 2014-10-03 NOTE — Telephone Encounter (Signed)
ntbs

## 2014-10-04 ENCOUNTER — Other Ambulatory Visit: Payer: Self-pay | Admitting: Family Medicine

## 2014-10-04 NOTE — Telephone Encounter (Signed)
Left message on voicemail that [facility]

## 2014-10-06 NOTE — Telephone Encounter (Signed)
Please have patient come and get a thyroid profile You may refill this prescription 1

## 2014-10-06 NOTE — Telephone Encounter (Signed)
Please advise on refill. No thyroid labs in epic

## 2014-10-14 ENCOUNTER — Other Ambulatory Visit: Payer: Self-pay | Admitting: Family Medicine

## 2014-10-25 ENCOUNTER — Other Ambulatory Visit: Payer: Self-pay | Admitting: Family Medicine

## 2014-11-01 ENCOUNTER — Other Ambulatory Visit: Payer: Self-pay | Admitting: Family Medicine

## 2014-11-03 NOTE — Telephone Encounter (Signed)
History of thyroid medicine and see if he can get home health to go out and draw a thyroid profile and get a BMP and a CBC

## 2014-11-03 NOTE — Telephone Encounter (Signed)
Last seen 08/2013. No TSH in Epic. She has dementia and husband says he cant control her combativeness to get her in. Only seen Owens & MinorBill Oxford in past.

## 2014-11-13 NOTE — Telephone Encounter (Signed)
This may be refilled 1.

## 2015-07-30 ENCOUNTER — Encounter: Payer: Self-pay | Admitting: Internal Medicine

## 2016-02-11 ENCOUNTER — Telehealth: Payer: Self-pay | Admitting: Family

## 2016-02-12 NOTE — Telephone Encounter (Signed)
Will have to get paper chart from [facility]

## 2016-03-04 NOTE — Telephone Encounter (Signed)
Chart has been requested

## 2018-03-22 DEATH — deceased
